# Patient Record
Sex: Male | Born: 1980 | Race: Black or African American | Hispanic: No | Marital: Single | Smoking: Former smoker
Health system: Southern US, Community
[De-identification: ages and names within clinical notes are randomized; demographics above are authoritative.]

## PROBLEM LIST (undated history)

## (undated) DIAGNOSIS — I10 Essential (primary) hypertension: Secondary | ICD-10-CM

## (undated) DIAGNOSIS — I8390 Asymptomatic varicose veins of unspecified lower extremity: Secondary | ICD-10-CM

## (undated) DIAGNOSIS — K219 Gastro-esophageal reflux disease without esophagitis: Secondary | ICD-10-CM

## (undated) DIAGNOSIS — I499 Cardiac arrhythmia, unspecified: Secondary | ICD-10-CM

## (undated) DIAGNOSIS — IMO0002 Reserved for concepts with insufficient information to code with codable children: Secondary | ICD-10-CM

## (undated) DIAGNOSIS — E119 Type 2 diabetes mellitus without complications: Secondary | ICD-10-CM

## (undated) DIAGNOSIS — I878 Other specified disorders of veins: Secondary | ICD-10-CM

## (undated) HISTORY — DX: Asymptomatic varicose veins of unspecified lower extremity: I83.90

## (undated) HISTORY — PX: VARICOSE VEIN SURGERY: SHX832

## (undated) HISTORY — DX: Cardiac arrhythmia, unspecified: I49.9

## (undated) HISTORY — DX: Reserved for concepts with insufficient information to code with codable children: IMO0002

---

## 2011-03-10 DIAGNOSIS — K219 Gastro-esophageal reflux disease without esophagitis: Secondary | ICD-10-CM | POA: Insufficient documentation

## 2011-03-11 ENCOUNTER — Encounter: Payer: Self-pay | Admitting: *Deleted

## 2011-03-11 ENCOUNTER — Emergency Department (HOSPITAL_BASED_OUTPATIENT_CLINIC_OR_DEPARTMENT_OTHER)
Admission: EM | Admit: 2011-03-11 | Discharge: 2011-03-11 | Discharge status: Against Medical Advice | Payer: PRIVATE HEALTH INSURANCE | Attending: Emergency Medicine | Admitting: Emergency Medicine

## 2011-03-11 HISTORY — DX: Essential (primary) hypertension: I10

## 2011-03-11 HISTORY — DX: Gastro-esophageal reflux disease without esophagitis: K21.9

## 2011-03-11 NOTE — ED Notes (Signed)
Pt left AMA prior to being seen by EDP - Pt states he is tired and doesn't wish to wait- states he will follow up with his doctor- advised pt he can return at any time if he likes

## 2011-03-11 NOTE — ED Notes (Signed)
Reports burning epigastric pain after eating- nausea also- has been using acid reducers without significant relief

## 2012-05-02 ENCOUNTER — Emergency Department: Payer: Self-pay | Admitting: Emergency Medicine

## 2012-05-02 LAB — CBC
HCT: 46 % (ref 40.0–52.0)
MCHC: 33.5 g/dL (ref 32.0–36.0)
MCV: 85 fL (ref 80–100)
Platelet: 203 10*3/uL (ref 150–440)
RBC: 5.4 10*6/uL (ref 4.40–5.90)
RDW: 14.1 % (ref 11.5–14.5)

## 2012-05-02 LAB — BASIC METABOLIC PANEL
BUN: 11 mg/dL (ref 7–18)
Calcium, Total: 8.6 mg/dL (ref 8.5–10.1)
Chloride: 108 mmol/L — ABNORMAL HIGH (ref 98–107)
Co2: 29 mmol/L (ref 21–32)
Creatinine: 1.13 mg/dL (ref 0.60–1.30)
EGFR (Non-African Amer.): >60
Glucose: 113 mg/dL — ABNORMAL HIGH (ref 65–99)
Osmolality: 283 (ref 275–301)
Potassium: 3.7 mmol/L (ref 3.5–5.1)
Sodium: 142 mmol/L (ref 136–145)

## 2012-05-02 LAB — TROPONIN I: Troponin-I: <0.02 ng/mL

## 2012-05-02 LAB — CK TOTAL AND CKMB (NOT AT ARMC): CK, Total: 154 U/L (ref 35–232)

## 2012-05-03 LAB — HEPATIC FUNCTION PANEL A (ARMC)
Albumin: 3.8 g/dL (ref 3.4–5.0)
Alkaline Phosphatase: 78 U/L (ref 50–136)
SGOT(AST): 38 U/L — ABNORMAL HIGH (ref 15–37)

## 2012-05-09 HISTORY — PX: CHOLECYSTECTOMY: SHX55

## 2012-05-15 ENCOUNTER — Ambulatory Visit: Payer: Self-pay | Admitting: Surgery

## 2012-05-16 LAB — PLATELET COUNT: Platelet: 189 10*3/uL (ref 150–440)

## 2012-05-19 LAB — PATHOLOGY REPORT

## 2012-06-24 ENCOUNTER — Emergency Department: Payer: Self-pay | Admitting: Emergency Medicine

## 2012-12-15 ENCOUNTER — Other Ambulatory Visit: Payer: Self-pay | Admitting: *Deleted

## 2012-12-15 DIAGNOSIS — I83893 Varicose veins of bilateral lower extremities with other complications: Secondary | ICD-10-CM

## 2013-01-11 ENCOUNTER — Encounter (HOSPITAL_COMMUNITY): Payer: PRIVATE HEALTH INSURANCE

## 2013-01-11 ENCOUNTER — Encounter: Payer: PRIVATE HEALTH INSURANCE | Admitting: Vascular Surgery

## 2013-01-22 ENCOUNTER — Encounter: Payer: Self-pay | Admitting: Surgery

## 2013-01-22 ENCOUNTER — Encounter: Payer: Self-pay | Admitting: Vascular Surgery

## 2013-01-25 ENCOUNTER — Ambulatory Visit (HOSPITAL_COMMUNITY)
Admission: RE | Admit: 2013-01-25 | Discharge: 2013-01-25 | Disposition: A | Discharge status: Home / Self Care | Payer: BC Managed Care – PPO | Source: Ambulatory Visit | Attending: Surgery | Admitting: Surgery

## 2013-01-25 ENCOUNTER — Encounter: Payer: Self-pay | Admitting: Surgery

## 2013-01-25 ENCOUNTER — Ambulatory Visit (INDEPENDENT_AMBULATORY_CARE_PROVIDER_SITE_OTHER): Payer: BC Managed Care – PPO | Admitting: Surgery

## 2013-01-25 ENCOUNTER — Encounter (INDEPENDENT_AMBULATORY_CARE_PROVIDER_SITE_OTHER): Payer: Self-pay

## 2013-01-25 VITALS — BP 138/91 | HR 78 | Resp 16 | Ht 74.0 in | Wt 285.0 lb

## 2013-01-25 DIAGNOSIS — I739 Peripheral vascular disease, unspecified: Secondary | ICD-10-CM | POA: Insufficient documentation

## 2013-01-25 DIAGNOSIS — M79609 Pain in unspecified limb: Secondary | ICD-10-CM

## 2013-01-25 DIAGNOSIS — I83893 Varicose veins of bilateral lower extremities with other complications: Secondary | ICD-10-CM | POA: Insufficient documentation

## 2013-01-25 DIAGNOSIS — IMO0002 Reserved for concepts with insufficient information to code with codable children: Secondary | ICD-10-CM

## 2013-01-25 DIAGNOSIS — I7025 Atherosclerosis of native arteries of other extremities with ulceration: Secondary | ICD-10-CM | POA: Insufficient documentation

## 2013-01-25 HISTORY — DX: Reserved for concepts with insufficient information to code with codable children: IMO0002

## 2013-01-25 NOTE — Progress Notes (Signed)
Vascular and Vein Specialist of [facility]   Patient name: Adam Holland MRN: [id number] DOB: 1981-03-18 Sex: male   Referred by: Dr. Fredrik Cove  Reason for referral:  Chief Complaint  Patient presents with  . New Evaluation    Pt. ref by Dr. Fredrik Cove  C/O  Right Foot  ulcer, painful and drainage, duration 1 week.  Left Foot has dry ulcer.      HISTORY OF PRESENT ILLNESS: This is a very pleasant 32 year old gentleman referred today for evaluation of a right foot ulcer.  The patient states that this has been present for approximately 2 weeks.  He is wrapping it with an Ace wrap.  He has previously had an ulcer in the same area approximately one year ago.  He was given antibiotics for this and ultimately went away.  He is concerned also about early ulceration of the left leg.  He does suffer from allergies.    Past Medical History  Diagnosis Date  . Hypertension   . Acid reflux   . Ulcer Oct. 20, 2014    Right foot and Left foot  . Irregular heart beat     Past Surgical History  Procedure Laterality Date  . Varicose vein surgery    . Cholecystectomy  Feb. 2014    Gall Bladder    History   Social History  . Marital Status: Single    Spouse Name: N/A    Number of Children: N/A  . Years of Education: N/A   Occupational History  . Not on file.   Social History Main Topics  . Smoking status: Former Smoker    Quit date: 04/08/1998  . Smokeless tobacco: Never Used  . Alcohol Use: Yes     Comment: occasional  . Drug Use: No  . Sexual Activity: Not on file   Other Topics Concern  . Not on file   Social History Narrative  . No narrative on file    Family History  Problem Relation Age of Onset  . Deep vein thrombosis Father     Varicose Veins  . Heart attack Father     Allergies as of 01/25/2013  . (No Known Allergies)    Current Outpatient Prescriptions on File Prior to Visit  Medication Sig Dispense Refill  . acetaminophen (TYLENOL) 650 MG CR tablet Take 650  mg by mouth every 8 (eight) hours as needed.        Marland Kitchen aluminum & magnesium hydroxide-simethicone (MYLANTA) 500-450-40 MG/5ML suspension Take by mouth every 6 (six) hours as needed.        . ranitidine (ZANTAC) 150 MG tablet Take 150 mg by mouth 2 (two) times daily.         No current facility-administered medications on file prior to visit.     REVIEW OF SYSTEMS: Cardiovascular: No chest pain, chest pressure, palpitations, orthopnea, or dyspnea on exertion. No claudication or rest pain,  No history of DVT or phlebitis. Pulmonary: No productive cough, asthma or wheezing. Neurologic: No weakness, paresthesias, aphasia, or amaurosis. No dizziness. Hematologic: No bleeding problems or clotting disorders. Musculoskeletal: No joint pain or joint swelling. Gastrointestinal: No blood in stool or hematemesis Genitourinary: No dysuria or hematuria. Psychiatric:: No history of major depression. Integumentary: No rashes or ulcers. Constitutional: No fever or chills.  PHYSICAL EXAMINATION: General: The patient appears their stated age.  Vital signs are BP 138/91  Pulse 78  Resp 16  Ht 6\' 2"  (1.88 m)  Wt 285 lb (129.275 kg)  BMI 36.58 kg/m2  SpO2 100% HEENT:  No gross abnormalities Pulmonary: Respirations are non-labored Abdomen: Soft and non-tender  Musculoskeletal: There are no major deformities.   Neurologic: No focal weakness or paresthesias are detected, Skin: Open ulceration at the medial ankle on the right leg as well as at the ball of the foot.  No cellulitis.  Early skin changes in the similar area on the left. Psychiatric: The patient has normal affect. Cardiovascular: There is a regular rate and rhythm without significant murmur appreciated.  Palpable pedal pulses  Diagnostic Studies: Venous reflux studies were performed today.  The patient has deep reflux bilaterally.  He also has segments within bilateral saphenous veins which are positive for reflux.  Bilateral short saphenous  veins show reflux.    Assessment:  Venous insufficiency with ulceration, right leg Plan: This is the patient's second venous ulcer.  The first was able to heal with antibiotics.  The patient does complain of swelling in his right leg.  For his ulcer, he wore compression stockings of unknown strength.  This ulcer is been present for about 2 weeks.  I am placing him in a Unna's boots on the right leg today.  This will be changed by home health 3 times a week.  He will followup with me in one month.  At that time, if his ulcer has healed I will give him a prescription for formal appropriate 20-30 mm compression stockings.  I will also refer him for possible laser ablation.  He was given 30 oxycodone tablets today for pain     V. Charlena Cross, M.D. V[facility] Vein Specialists of [place] Office: [phone] Pager:  [phone]

## 2013-01-26 ENCOUNTER — Ambulatory Visit (INDEPENDENT_AMBULATORY_CARE_PROVIDER_SITE_OTHER): Payer: BC Managed Care – PPO | Admitting: *Deleted

## 2013-01-26 DIAGNOSIS — I83893 Varicose veins of bilateral lower extremities with other complications: Secondary | ICD-10-CM

## 2013-01-26 DIAGNOSIS — I739 Peripheral vascular disease, unspecified: Secondary | ICD-10-CM

## 2013-01-26 NOTE — Progress Notes (Signed)
Patient called c/o excessive drainage oozing from unna boot.  I scheduled him to come to office.  I removed unna boot, thoroughly cleansed his leg and foot with carra klenz and applied a new unna boot dressing.  The patient is returning Friday, October 24,2014.  He is to call sooner if any changes occur. Patient understood instructions given.

## 2013-01-27 ENCOUNTER — Telehealth: Payer: Self-pay | Admitting: *Deleted

## 2013-01-27 NOTE — Telephone Encounter (Signed)
I called patient to inform him that I had contacted [facility], [facility], [facility] and [facility] and none of the agencies would agree to serve him.  I asked the patient to come to the office and I would provide the care needed three times weekly. The patient agrees and understands the instructions.

## 2013-01-29 ENCOUNTER — Encounter (INDEPENDENT_AMBULATORY_CARE_PROVIDER_SITE_OTHER): Payer: BC Managed Care – PPO

## 2013-01-29 DIAGNOSIS — I739 Peripheral vascular disease, unspecified: Secondary | ICD-10-CM

## 2013-02-01 ENCOUNTER — Encounter (INDEPENDENT_AMBULATORY_CARE_PROVIDER_SITE_OTHER): Payer: BC Managed Care – PPO

## 2013-02-01 DIAGNOSIS — I83893 Varicose veins of bilateral lower extremities with other complications: Secondary | ICD-10-CM

## 2013-02-03 ENCOUNTER — Ambulatory Visit (INDEPENDENT_AMBULATORY_CARE_PROVIDER_SITE_OTHER): Payer: BC Managed Care – PPO | Admitting: Vascular Surgery

## 2013-02-03 ENCOUNTER — Encounter (INDEPENDENT_AMBULATORY_CARE_PROVIDER_SITE_OTHER): Payer: BC Managed Care – PPO

## 2013-02-03 ENCOUNTER — Encounter: Payer: Self-pay | Admitting: Vascular Surgery

## 2013-02-03 VITALS — BP 138/87 | HR 73 | Resp 18 | Ht 74.0 in | Wt 280.0 lb

## 2013-02-03 DIAGNOSIS — I83009 Varicose veins of unspecified lower extremity with ulcer of unspecified site: Secondary | ICD-10-CM

## 2013-02-03 DIAGNOSIS — I83893 Varicose veins of bilateral lower extremities with other complications: Secondary | ICD-10-CM

## 2013-02-03 DIAGNOSIS — I739 Peripheral vascular disease, unspecified: Secondary | ICD-10-CM

## 2013-02-03 DIAGNOSIS — IMO0001 Reserved for inherently not codable concepts without codable children: Secondary | ICD-10-CM

## 2013-02-03 MED ORDER — TRAMADOL HCL 50 MG PO TABS: 50.0000 mg | ORAL_TABLET | Freq: Four times a day (QID) | ORAL | Status: DC | PRN

## 2013-02-03 NOTE — Progress Notes (Signed)
VASCULAR & VEIN SPECIALISTS OF   Established Venous Insufficiency  History of Present Illness  Adam Holland is a 32 y.o. (28-Nov-1980) male who presents with chief complaint: pruritus and skin rashes from Oxycodone.  This patient is now 3 weeks into Unna boot to RLE for venous stasis ulcer.  The patient's had pruritus and development of rashes on both palms after starting oxycodone.  He notes no pruritus when not taking the Oxycodone.  The patient's treatment regimen currently included: maximal medical management and Unna boot.  The patient's PMH, PSH, SH, FamHx, Med, and Allergies are unchanged from 01/25/13.  On ROS today: pruritus, rash on both palms  Physical Examination  Filed Vitals:   02/03/13 1558  BP: 138/87  Pulse: 73  Resp: 18  Height: 6\' 2"  (1.88 m)  Weight: 280 lb (127.007 kg)   Body mass index is 35.93 kg/(m^2).  General: A&O x 3, WD, obese  Vascular: palpable pulse in R foot  Musculoskeletal: M/S 5/5 throughout , two small areas of skin breakdown on dorsum of right foot, swelling 1+, no tracking erythema or TTP light touch  Neurologic: Pain and light touch intact in extremities , Motor exam as listed above  Dermatologic:  Vesicular lesions on both palms without frank drainage  Medical Decision Making  Adam Holland is a 32 y.o. male who presents with: RLE VSU, allergic reaction to oxycodone   The patient is going to continue with Unna boot to RLE.  He will discontinue the oxycodone and start Ultram 50 mg 1 po q6 hr prn pain  He already has follow up setup with Dr. Myra Gianotti.  Thank you for allowing [place] to participate in this patient's care.  Leonides Sake, MD V[facility] and Vein Specialists of [place] Office: [phone] Pager: [phone]  02/03/2013, 5:38 PM

## 2013-02-04 ENCOUNTER — Encounter: Payer: Self-pay | Admitting: *Deleted

## 2013-02-05 ENCOUNTER — Ambulatory Visit (INDEPENDENT_AMBULATORY_CARE_PROVIDER_SITE_OTHER): Payer: BC Managed Care – PPO

## 2013-02-05 ENCOUNTER — Encounter (HOSPITAL_COMMUNITY): Payer: Self-pay | Admitting: Emergency Medicine

## 2013-02-05 ENCOUNTER — Emergency Department (HOSPITAL_COMMUNITY)
Admission: EM | Admit: 2013-02-05 | Discharge: 2013-02-05 | Disposition: A | Discharge status: Home / Self Care | Payer: BC Managed Care – PPO | Attending: Emergency Medicine | Admitting: Emergency Medicine

## 2013-02-05 DIAGNOSIS — IMO0001 Reserved for inherently not codable concepts without codable children: Secondary | ICD-10-CM

## 2013-02-05 DIAGNOSIS — Z79899 Other long term (current) drug therapy: Secondary | ICD-10-CM | POA: Insufficient documentation

## 2013-02-05 DIAGNOSIS — K219 Gastro-esophageal reflux disease without esophagitis: Secondary | ICD-10-CM | POA: Insufficient documentation

## 2013-02-05 DIAGNOSIS — I83009 Varicose veins of unspecified lower extremity with ulcer of unspecified site: Secondary | ICD-10-CM

## 2013-02-05 DIAGNOSIS — M79609 Pain in unspecified limb: Secondary | ICD-10-CM | POA: Insufficient documentation

## 2013-02-05 DIAGNOSIS — R21 Rash and other nonspecific skin eruption: Secondary | ICD-10-CM | POA: Insufficient documentation

## 2013-02-05 DIAGNOSIS — Z87891 Personal history of nicotine dependence: Secondary | ICD-10-CM | POA: Insufficient documentation

## 2013-02-05 DIAGNOSIS — L97909 Non-pressure chronic ulcer of unspecified part of unspecified lower leg with unspecified severity: Secondary | ICD-10-CM

## 2013-02-05 DIAGNOSIS — Z791 Long term (current) use of non-steroidal anti-inflammatories (NSAID): Secondary | ICD-10-CM | POA: Insufficient documentation

## 2013-02-05 DIAGNOSIS — M79671 Pain in right foot: Secondary | ICD-10-CM

## 2013-02-05 DIAGNOSIS — L301 Dyshidrosis [pompholyx]: Secondary | ICD-10-CM | POA: Insufficient documentation

## 2013-02-05 DIAGNOSIS — I10 Essential (primary) hypertension: Secondary | ICD-10-CM | POA: Insufficient documentation

## 2013-02-05 LAB — CBC WITH DIFFERENTIAL/PLATELET
Basophils Relative: 0 % (ref 0–1)
Eosinophils Absolute: 0.4 10*3/uL (ref 0.0–0.7)
Eosinophils Relative: 4 % (ref 0–5)
HCT: 42.2 % (ref 39.0–52.0)
Hemoglobin: 14.2 g/dL (ref 13.0–17.0)
MCH: 28.6 pg (ref 26.0–34.0)
MCHC: 33.6 g/dL (ref 30.0–36.0)
MCV: 85.1 fL (ref 78.0–100.0)
Monocytes Absolute: 0.9 10*3/uL (ref 0.1–1.0)
Monocytes Relative: 8 % (ref 3–12)
Neutro Abs: 6.8 10*3/uL (ref 1.7–7.7)
RDW: 14.1 % (ref 11.5–15.5)

## 2013-02-05 LAB — COMPREHENSIVE METABOLIC PANEL
Alkaline Phosphatase: 52 U/L (ref 39–117)
BUN: 12 mg/dL (ref 6–23)
Creatinine, Ser: 0.94 mg/dL (ref 0.50–1.35)
GFR calc Af Amer: >90 mL/min (ref >90)
Glucose, Bld: 101 mg/dL — ABNORMAL HIGH (ref 70–99)
Potassium: 3.8 mEq/L (ref 3.5–5.1)
Total Protein: 7.1 g/dL (ref 6.0–8.3)

## 2013-02-05 LAB — ACETAMINOPHEN LEVEL: Acetaminophen (Tylenol), Serum: <15 ug/mL (ref 10–30)

## 2013-02-05 MED ORDER — TRIAMCINOLONE ACETONIDE 0.1 % EX CREA: TOPICAL_CREAM | Freq: Two times a day (BID) | CUTANEOUS | Status: DC

## 2013-02-05 MED ORDER — FEXOFENADINE HCL 60 MG PO TABS: 180.0000 mg | ORAL_TABLET | Freq: Two times a day (BID) | ORAL | Status: DC

## 2013-02-05 MED ORDER — FEXOFENADINE HCL 60 MG PO TABS: 180.0000 mg | ORAL_TABLET | Freq: Every day | ORAL | Status: DC

## 2013-02-05 MED ORDER — HYDROCODONE-ACETAMINOPHEN 5-325 MG PO TABS: 2.0000 | ORAL_TABLET | ORAL | Status: DC | PRN

## 2013-02-05 MED ORDER — HYDROMORPHONE HCL PF 2 MG/ML IJ SOLN
2.0000 mg | Freq: Once | INTRAMUSCULAR | Status: AC
  Administered 2013-02-05: 2 mg via INTRAMUSCULAR
  Filled 2013-02-05: qty 1

## 2013-02-05 MED ORDER — ONDANSETRON 4 MG PO TBDP
8.0000 mg | ORAL_TABLET | Freq: Once | ORAL | Status: AC
  Administered 2013-02-05: 8 mg via ORAL
  Filled 2013-02-05: qty 2

## 2013-02-05 MED ORDER — ONDANSETRON 8 MG PO TBDP: 8.0000 mg | ORAL_TABLET | Freq: Three times a day (TID) | ORAL | Status: DC | PRN

## 2013-02-05 NOTE — Progress Notes (Signed)
Mr. Adam Holland was seen today for an Adam Holland Boot change on his Right Medial foot.  Wound was cleansed with Adam Holland and dry 4X4's.   Pt. Complains of pain level 8 plus. Wound on  Right medial foot had signs of serous exudate and discoloration.  New Unna Boot was applied with no difficulty. Mr.  Holland will return in one week for reapplication.  Pt. Voiced understanding of instructions.  Adam Holland, RMA-AMT

## 2013-02-05 NOTE — ED Provider Notes (Signed)
CSN: [id number]     Arrival date & time 02/05/13  0250 History   First MD Initiated Contact with Patient 02/05/13 0308     Chief Complaint  Patient presents with  . Foot Pain   (Consider location/radiation/quality/duration/timing/severity/associated sxs/prior Treatment) HPI 32 year old male presents to emergency room complaining of right foot pain.  Patient has venous stasis ulcer of the right ankle.  He is currently in a do it.  He is being followed by [facility], Dr. Imogene Burn.  Patient recently developed a rash to his hands that he has attributed to Percocet.  He stopped taking the Percocet, secondary to the rash.  He was switched to tramadol, which she reports is not helping his pain.  Tonight he took 2 doses as well as Tylenol, felt dizzy, and sick to his stomach.  He denies any fevers, chills.  He denies any risk factors for HIV or syphilis.  Patient has history of eczema.  He has followup today at the vein specialist clinic Past Medical History  Diagnosis Date  . Hypertension   . Acid reflux   . Ulcer Oct. 20, 2014    Right foot and Left foot  . Irregular heart beat   . Varicose veins    Past Surgical History  Procedure Laterality Date  . Varicose vein surgery    . Cholecystectomy  Feb. 2014    Gall Bladder   Family History  Problem Relation Age of Onset  . Deep vein thrombosis Father     Varicose Veins  . Heart attack Father    History  Substance Use Topics  . Smoking status: Former Smoker    Types: Cigarettes    Quit date: 04/08/1998  . Smokeless tobacco: Never Used  . Alcohol Use: Yes     Comment: occasional    Review of Systems  See History of Present Illness; otherwise all other systems are reviewed and negative Allergies  Oxycodone  Home Medications   Current Outpatient Rx  Name  Route  Sig  Dispense  Refill  . acetaminophen (TYLENOL) 650 MG CR tablet   Oral   Take 650 mg by mouth every 8 (eight) hours as needed.           . furosemide  (LASIX) 20 MG tablet   Oral   Take 20 mg by mouth daily.         . naproxen sodium (ANAPROX) 220 MG tablet   Oral   Take 220 mg by mouth 2 (two) times daily with a meal.         . aluminum & magnesium hydroxide-simethicone (MYLANTA) 500-450-40 MG/5ML suspension   Oral   Take by mouth every 6 (six) hours as needed.           . fexofenadine (ALLEGRA) 60 MG tablet   Oral   Take 3 tablets (180 mg total) by mouth daily.   30 tablet   0   . HYDROcodone-acetaminophen (NORCO/VICODIN) 5-325 MG per tablet   Oral   Take 2 tablets by mouth every 4 (four) hours as needed for pain.   20 tablet   0   . ondansetron (ZOFRAN ODT) 8 MG disintegrating tablet   Oral   Take 1 tablet (8 mg total) by mouth every 8 (eight) hours as needed for nausea.   20 tablet   0   . ranitidine (ZANTAC) 150 MG tablet   Oral   Take 150 mg by mouth 2 (two) times daily.           Marland Kitchen  triamcinolone cream (KENALOG) 0.1 %   Topical   Apply topically 2 (two) times daily.   30 g   0    BP 155/91  Pulse 67  Temp(Src) 97.8 F (36.6 C) (Oral)  Resp 19  SpO2 99% Physical Exam  Nursing note and vitals reviewed. Constitutional: He is oriented to person, place, and time. He appears well-developed and well-nourished. He appears distressed.  HENT:  Head: Normocephalic and atraumatic.  Right Ear: External ear normal.  Left Ear: External ear normal.  Nose: Nose normal.  Mouth/Throat: Oropharynx is clear and moist. No oropharyngeal exudate.  Eyes: Conjunctivae and EOM are normal. Pupils are equal, round, and reactive to light.  Neck: Normal range of motion. Neck supple. No JVD present. No tracheal deviation present. No thyromegaly present.  Cardiovascular: Normal rate, regular rhythm, normal heart sounds and intact distal pulses.  Exam reveals no gallop and no friction rub.   No murmur heard. Pulmonary/Chest: Effort normal and breath sounds normal. No stridor. No respiratory distress. He has no wheezes. He  has no rales. He exhibits no tenderness.  Abdominal: Soft. Bowel sounds are normal. He exhibits no distension and no mass. There is no tenderness. There is no rebound and no guarding.  Musculoskeletal:  Right leg wrapped in una boot area and cap refill of toes.  Less than 3 seconds  Lymphadenopathy:    He has no cervical adenopathy.  Neurological: He is alert and oriented to person, place, and time. He exhibits normal muscle tone. Coordination normal.  Skin: Skin is warm and dry. Rash (patient has vesicular rash to both hands, palmar and along fingers.) noted. No erythema. No pallor.    ED Course  Procedures (including critical care time) Labs Review Labs Reviewed  CBC WITH DIFFERENTIAL - Abnormal; Notable for the following:    WBC 10.6 (*)    All other components within normal limits  COMPREHENSIVE METABOLIC PANEL - Abnormal; Notable for the following:    Glucose, Bld 101 (*)    Total Bilirubin 0.2 (*)    All other components within normal limits  ACETAMINOPHEN LEVEL  RPR   Imaging Review No results found.    MDM   1. Foot pain, right   2. Dyshidrosis    Patient feeling better after pain medication.  I do not feel his rash is secondary to Percocet, especially in light of history of eczema.  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  Will start on a steroid cream, and Allegra.  Patient followup with dermatology.  He does not have a primary care Dr., I have given him the wellness clinic.  Number.    Olivia Mackie, MD 02/05/13 [phone]

## 2013-02-05 NOTE — ED Notes (Signed)
Patient is being seen by [facility] for a venous stasis ulcer of the right ankle. Patient has been taking Tramadol and tylenol for pain, but patient is reporting that has not helped and has made him sick.

## 2013-02-08 ENCOUNTER — Encounter (INDEPENDENT_AMBULATORY_CARE_PROVIDER_SITE_OTHER): Payer: BC Managed Care – PPO

## 2013-02-08 DIAGNOSIS — I739 Peripheral vascular disease, unspecified: Secondary | ICD-10-CM

## 2013-02-08 DIAGNOSIS — I83893 Varicose veins of bilateral lower extremities with other complications: Secondary | ICD-10-CM

## 2013-02-08 DIAGNOSIS — I83009 Varicose veins of unspecified lower extremity with ulcer of unspecified site: Secondary | ICD-10-CM

## 2013-02-10 ENCOUNTER — Encounter (INDEPENDENT_AMBULATORY_CARE_PROVIDER_SITE_OTHER): Payer: BC Managed Care – PPO

## 2013-02-10 DIAGNOSIS — I83009 Varicose veins of unspecified lower extremity with ulcer of unspecified site: Secondary | ICD-10-CM

## 2013-02-10 DIAGNOSIS — I739 Peripheral vascular disease, unspecified: Secondary | ICD-10-CM

## 2013-02-10 DIAGNOSIS — I83893 Varicose veins of bilateral lower extremities with other complications: Secondary | ICD-10-CM

## 2013-02-12 ENCOUNTER — Encounter (INDEPENDENT_AMBULATORY_CARE_PROVIDER_SITE_OTHER): Payer: BC Managed Care – PPO

## 2013-02-12 DIAGNOSIS — I83893 Varicose veins of bilateral lower extremities with other complications: Secondary | ICD-10-CM

## 2013-02-17 ENCOUNTER — Ambulatory Visit (INDEPENDENT_AMBULATORY_CARE_PROVIDER_SITE_OTHER): Payer: BC Managed Care – PPO | Admitting: *Deleted

## 2013-02-17 DIAGNOSIS — I83893 Varicose veins of bilateral lower extremities with other complications: Secondary | ICD-10-CM

## 2013-02-24 ENCOUNTER — Encounter (INDEPENDENT_AMBULATORY_CARE_PROVIDER_SITE_OTHER): Payer: BC Managed Care – PPO

## 2013-02-24 DIAGNOSIS — I83893 Varicose veins of bilateral lower extremities with other complications: Secondary | ICD-10-CM

## 2013-02-26 ENCOUNTER — Encounter: Payer: Self-pay | Admitting: Surgery

## 2013-02-26 NOTE — Progress Notes (Signed)
Patient was in for unna boot change. Cleaned right leg with Deatra James. The wound is healing and patient is stating the pain is subsiding and the draining is less.  He is returning on Monday, 03/01/13 to see Dr Myra Gianotti.

## 2013-03-01 ENCOUNTER — Encounter: Payer: Self-pay | Admitting: Surgery

## 2013-03-01 ENCOUNTER — Ambulatory Visit (INDEPENDENT_AMBULATORY_CARE_PROVIDER_SITE_OTHER): Payer: BC Managed Care – PPO | Admitting: Surgery

## 2013-03-01 VITALS — BP 131/82 | HR 82 | Ht 74.0 in | Wt 284.0 lb

## 2013-03-01 DIAGNOSIS — I83009 Varicose veins of unspecified lower extremity with ulcer of unspecified site: Secondary | ICD-10-CM

## 2013-03-01 DIAGNOSIS — IMO0001 Reserved for inherently not codable concepts without codable children: Secondary | ICD-10-CM

## 2013-03-01 NOTE — Progress Notes (Signed)
Patient name: Adam Holland MRN: [id number] DOB: 1981-03-01 Sex: male     Chief Complaint  Patient presents with  . Re-evaluation    1 month f/u - unna boot changes    HISTORY OF PRESENT ILLNESS: The patient comes back today for followup.  I met him approximately one month ago for evaluation of a right foot ulcer that had been present at that time for 2 weeks.  He had previously had an ulcer in the same area about one year ago.  The patient has been wearing graduated compression, 20-30 mm Hg since 2003.  I placed him in a Unna boot to help heal his ulcer.  He also had a venous insufficiency workup which reveals reflux within the deep system on the right as well as the short saphenous and a section of the great saphenous vein.  The patient feels that his foot has improved significantly and he also has decreased in size  Past Medical History  Diagnosis Date  . Hypertension   . Acid reflux   . Ulcer Oct. 20, 2014    Right foot and Left foot  . Irregular heart beat   . Varicose veins     Past Surgical History  Procedure Laterality Date  . Varicose vein surgery    . Cholecystectomy  Feb. 2014    Gall Bladder    History   Social History  . Marital Status: Single    Spouse Name: N/A    Number of Children: N/A  . Years of Education: N/A   Occupational History  . Not on file.   Social History Main Topics  . Smoking status: Former Smoker    Types: Cigarettes    Quit date: 04/08/1998  . Smokeless tobacco: Never Used  . Alcohol Use: Yes     Comment: occasional  . Drug Use: No  . Sexual Activity: Not on file   Other Topics Concern  . Not on file   Social History Narrative  . No narrative on file    Family History  Problem Relation Age of Onset  . Deep vein thrombosis Father     Varicose Veins  . Heart attack Father     Allergies as of 03/01/2013 - Review Complete 03/01/2013  Allergen Reaction Noted  . Oxycodone  02/05/2013    Current Outpatient  Prescriptions on File Prior to Visit  Medication Sig Dispense Refill  . acetaminophen (TYLENOL) 650 MG CR tablet Take 650 mg by mouth every 8 (eight) hours as needed.        Marland Kitchen aluminum & magnesium hydroxide-simethicone (MYLANTA) 500-450-40 MG/5ML suspension Take by mouth every 6 (six) hours as needed.        . fexofenadine (ALLEGRA) 60 MG tablet Take 3 tablets (180 mg total) by mouth daily.  30 tablet  0  . furosemide (LASIX) 20 MG tablet Take 20 mg by mouth daily.      Marland Kitchen HYDROcodone-acetaminophen (NORCO/VICODIN) 5-325 MG per tablet Take 2 tablets by mouth every 4 (four) hours as needed for pain.  20 tablet  0  . naproxen sodium (ANAPROX) 220 MG tablet Take 220 mg by mouth 2 (two) times daily with a meal.      . ondansetron (ZOFRAN ODT) 8 MG disintegrating tablet Take 1 tablet (8 mg total) by mouth every 8 (eight) hours as needed for nausea.  20 tablet  0  . ranitidine (ZANTAC) 150 MG tablet Take 150 mg by mouth 2 (two) times daily.        Marland Kitchen  triamcinolone cream (KENALOG) 0.1 % Apply topically 2 (two) times daily.  30 g  0   No current facility-administered medications on file prior to visit.     REVIEW OF SYSTEMS: Please see history of present illness, otherwise no changes  PHYSICAL EXAMINATION:   Vital signs are BP 131/82  Pulse 82  Ht 6\' 2"  (1.88 m)  Wt 284 lb (128.822 kg)  BMI 36.45 kg/m2  SpO2 98% General: The patient appears their stated age. HEENT:  No gross abnormalities Pulmonary:  Non labored breathing Musculoskeletal: There are no major deformities. Neurologic: No focal weakness or paresthesias are detected, Skin: Chronic skin changes on the medial side of the right leg.  There is also a 2 mm x 7 mm ulcer on the medial side of the foot.  No surrounding erythema. Psychiatric: The patient has normal affect. Cardiovascular: There is a regular rate and rhythm.  Palpable pedal pulse   Diagnostic Studies The patient underwent duplex evaluation last month which showed  bilateral deep system reflux.  There is also bilateral great and small saphenous reflux.  Assessment: Venous stasis ulcer Plan: The patient's pain in his right leg has improved somewhat with compression and ulcer healing, however he still does have pain in that foot.  His swelling has also improved with the [employer].  Because of the ulcer and the pain as well as persistent edema, I feel he would be a candidate for laser ablation of the small saphenous vein and possibly the great saphenous vein on the right to facilitate ulcer healing as well as decrease the recurrence rate.  The patient understands that this may not completely correct his edema in his right leg but should offer some improvement in his symptomatology.  He was to get this done as soon as possible.  [facility] for further evaluation and insurance approval.  He will be here tomorrow.  Jorge Ny, M.D. [facility] Special[facility] of [place] Office: [phone] Pager:  [phone]

## 2013-03-02 ENCOUNTER — Encounter: Payer: Self-pay | Admitting: Vascular Surgery

## 2013-03-02 ENCOUNTER — Ambulatory Visit (INDEPENDENT_AMBULATORY_CARE_PROVIDER_SITE_OTHER): Payer: BC Managed Care – PPO | Admitting: Vascular Surgery

## 2013-03-02 VITALS — BP 137/90 | HR 83 | Resp 16 | Ht 74.0 in | Wt 284.0 lb

## 2013-03-02 DIAGNOSIS — I83009 Varicose veins of unspecified lower extremity with ulcer of unspecified site: Secondary | ICD-10-CM

## 2013-03-02 DIAGNOSIS — IMO0001 Reserved for inherently not codable concepts without codable children: Secondary | ICD-10-CM

## 2013-03-02 DIAGNOSIS — L97909 Non-pressure chronic ulcer of unspecified part of unspecified lower leg with unspecified severity: Secondary | ICD-10-CM

## 2013-03-02 NOTE — Progress Notes (Signed)
Problems with Activities of Daily Living Secondary to Leg Pain  1. 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   Has to stand for prolonged periods and lift and assist patients.  This is extremely difficult die to leg pain and ankle ulcer.   2. 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  Has to stand for 8 hours and assist patients and this is very difficult due to leg pain and ankle ulcer.   3. Adam Holland has a small son and has difficulty with childcare, cleaning house, climbing stairs due to leg pain and ankle ulcer.   Failure of  Conservative Therapy:  1. Worn 20-30 mm Hg thigh high compression hose >3 months with no relief of symptoms.  2. Frequently elevates legs-no relief of symptoms  3. Taken Ibuprofen 600 Mg TID with no relief of symptoms.  Here today for continued discussion regarding possible treatment of his venous hypertension. I've reviewed his notes from Dr. Myra Gianotti and also his bilateral venous duplex exam from 01/25/2013. I damaged his veins with SonoSite ultrasound today myself. This does show gross reflux in his right small saphenous vein with extension of the tributaries in his calf. He does have a prior history of vein stripping. The details of this are unavailable. From his duplex it looks like he may have had tributary phlebectomy or may have had proximal venous stripping. Once imaging his right great saphenous vein there is some patency of this to the level of the knee with reflux but no gross reflux throughout his thigh.  The venous ulcer does not have any evidence of infection on the medial malleolus but is very slow in healing.  Impression and plan severe right leg venous hypertension with lesser hypertension in the left leg. I feel that the majority of his hypertension is related to the enlarged refluxing small saphenous vein. I feel that his great saphenous vein in all likelihood has a lesser intact of this. I would recommend ablation of his small  saphenous vein. He continues to have trouble he may require a right great saphenous vein ablation in the future. He will continue his local wound care until we can proceed with the procedure. He would like to schedule this as soon as possible. He understands this is an outpatient procedure under local anesthesia.

## 2013-03-12 ENCOUNTER — Encounter (INDEPENDENT_AMBULATORY_CARE_PROVIDER_SITE_OTHER): Payer: BC Managed Care – PPO

## 2013-03-12 DIAGNOSIS — I83009 Varicose veins of unspecified lower extremity with ulcer of unspecified site: Secondary | ICD-10-CM

## 2013-03-16 ENCOUNTER — Other Ambulatory Visit: Payer: Self-pay | Admitting: *Deleted

## 2013-03-16 ENCOUNTER — Telehealth: Payer: Self-pay | Admitting: Vascular Surgery

## 2013-03-16 DIAGNOSIS — I83019 Varicose veins of right lower extremity with ulcer of unspecified site: Secondary | ICD-10-CM

## 2013-03-16 DIAGNOSIS — M79609 Pain in unspecified limb: Secondary | ICD-10-CM

## 2013-03-16 DIAGNOSIS — I83029 Varicose veins of left lower extremity with ulcer of unspecified site: Secondary | ICD-10-CM

## 2013-03-16 MED ORDER — SILVER SULFADIAZINE 1 % EX CREA: 1.0000 "application " | TOPICAL_CREAM | Freq: Every day | CUTANEOUS | Status: DC

## 2013-03-16 NOTE — Telephone Encounter (Addendum)
Message copied by Fredrich Birks on Tue Mar 16, 2013  1:54 PM ------      Message from: Domenic Moras D      Created: Tue Mar 16, 2013 10:15 AM      Regarding: scheduling       Please schedule Alison Stalling for post laser ablation duplex (right leg, order in EPIC)  and VV FU with Dr. Arbie Cookey on 03-30-2013.  Thanks! ------  03/16/13: sent msg back to sonya, pt is scheduled 12/23 11:00am for lab and 11:45am TFE  dpm

## 2013-03-17 ENCOUNTER — Encounter (INDEPENDENT_AMBULATORY_CARE_PROVIDER_SITE_OTHER): Payer: BC Managed Care – PPO

## 2013-03-17 DIAGNOSIS — I83009 Varicose veins of unspecified lower extremity with ulcer of unspecified site: Secondary | ICD-10-CM

## 2013-03-23 ENCOUNTER — Encounter: Payer: Self-pay | Admitting: Vascular Surgery

## 2013-03-24 ENCOUNTER — Ambulatory Visit (INDEPENDENT_AMBULATORY_CARE_PROVIDER_SITE_OTHER): Payer: BC Managed Care – PPO | Admitting: Vascular Surgery

## 2013-03-24 ENCOUNTER — Encounter: Payer: Self-pay | Admitting: Vascular Surgery

## 2013-03-24 VITALS — BP 132/85 | HR 73 | Resp 18 | Ht 74.0 in | Wt 295.0 lb

## 2013-03-24 DIAGNOSIS — I83019 Varicose veins of right lower extremity with ulcer of unspecified site: Secondary | ICD-10-CM

## 2013-03-24 DIAGNOSIS — I83009 Varicose veins of unspecified lower extremity with ulcer of unspecified site: Secondary | ICD-10-CM

## 2013-03-24 HISTORY — PX: ENDOVENOUS ABLATION SAPHENOUS VEIN W/ LASER: SUR449

## 2013-03-24 NOTE — Progress Notes (Signed)
   Laser Ablation Procedure      Date: 03/24/2013    Adam Holland DOB:1980/05/24  Consent signed: Yes  Surgeon:T.F. Early  Procedure: Laser Ablation: right Small Saphenous Vein  BP 132/85  Pulse 73  Resp 18  Ht 6\' 2"  (1.88 m)  Wt 295 lb (133.811 kg)  BMI 37.86 kg/m2  Start time: 10:40AM   End time: 11:40AM  Tumescent Anesthesia: 425 cc 0.9% NaCl with 50 cc Lidocaine HCL with 1% Epi and 15 cc 8.4% NaHCO3  Local Anesthesia: 2 cc Lidocaine HCL and NaHCO3 (ratio 2:1)  Continuous Mode: 15 Watts Total Energy 1571 Joules Total Time1:44      Patient tolerated procedure well: Yes   Description of Procedure:  After marking the course of the saphenous vein and the secondary varicosities in the standing position, the patient was placed on the operating table in the prone position, and the right leg was prepped and draped in sterile fashion. Local anesthetic was administered, and under ultrasound guidance the saphenous vein was accessed with a micro needle and guide wire; then the micro puncture sheath was placed. A guide wire was inserted to the saphenopopliteal junction, followed by a 5 french sheath.  The position of the sheath and then the laser fiber below the junction was confirmed using the ultrasound and visualization of the aiming beam.  Tumescent anesthesia was administered along the course of the saphenous vein using ultrasound guidance. Protective laser glasses were placed on the patient, and the laser was fired at at 15 watt continuous mode.  For a total of 1571 joules.  A steri strip was applied to the puncture site.  ABD pads and thigh high compression stockings were applied.  Ace wrap bandages were applied over the phlebectomy sites and at the top of the saphenopopliteal junction.  Blood loss was less than 15 cc.  The patient ambulated out of the operating room having tolerated the procedure well.

## 2013-03-25 ENCOUNTER — Telehealth: Payer: Self-pay | Admitting: *Deleted

## 2013-03-25 NOTE — Telephone Encounter (Signed)
Left voice (phone) message to check on his post procedure status. Asked that he call me back this afternoon.

## 2013-03-25 NOTE — Telephone Encounter (Signed)
    03/25/2013  Time: 3:41 PM   Patient Name: Adam Holland  Patient of: T.F. Early  Procedure:Laser Ablation right small saphenous vein 03-24-2013  Reached patient at home and checked  His status  Yes    Comments/Actions Taken: [name] states that he has mild discomfort in right posterior calf (area that was ablated).  Encouraged him to keep right leg elevated when sitting, take Ibuprofen 600 mg three times a day (with meals) and use ice compress as needed for pain.  Reviewed post procedural instructions with him and reminded him of post LA duplex and VV FU with Dr. Arbie Cookey on 03-30-2013.     @SIGNATURE @

## 2013-03-26 ENCOUNTER — Encounter: Payer: Self-pay | Admitting: *Deleted

## 2013-03-29 ENCOUNTER — Encounter: Payer: Self-pay | Admitting: Vascular Surgery

## 2013-03-30 ENCOUNTER — Ambulatory Visit (HOSPITAL_COMMUNITY)
Admission: RE | Admit: 2013-03-30 | Discharge: 2013-03-30 | Disposition: A | Discharge status: Home / Self Care | Payer: BC Managed Care – PPO | Source: Ambulatory Visit | Attending: Vascular Surgery | Admitting: Vascular Surgery

## 2013-03-30 ENCOUNTER — Encounter: Payer: Self-pay | Admitting: *Deleted

## 2013-03-30 ENCOUNTER — Encounter: Payer: Self-pay | Admitting: Vascular Surgery

## 2013-03-30 ENCOUNTER — Ambulatory Visit (INDEPENDENT_AMBULATORY_CARE_PROVIDER_SITE_OTHER): Payer: BC Managed Care – PPO | Admitting: Vascular Surgery

## 2013-03-30 VITALS — BP 131/83 | HR 76 | Resp 16 | Ht 74.0 in | Wt 285.0 lb

## 2013-03-30 DIAGNOSIS — I83009 Varicose veins of unspecified lower extremity with ulcer of unspecified site: Secondary | ICD-10-CM

## 2013-03-30 DIAGNOSIS — I83019 Varicose veins of right lower extremity with ulcer of unspecified site: Secondary | ICD-10-CM

## 2013-03-30 DIAGNOSIS — I82819 Embolism and thrombosis of superficial veins of unspecified lower extremities: Secondary | ICD-10-CM | POA: Insufficient documentation

## 2013-03-30 DIAGNOSIS — M79609 Pain in unspecified limb: Secondary | ICD-10-CM

## 2013-03-30 NOTE — Progress Notes (Signed)
The patient presents today for followup of his laser ablation of his right small saphenous vein. He had minimal discomfort associated with the procedure and following. This was on 03/24/2013. He has had healing of his medial malleolus ulcer. He has been extremely compliant with his compression garments. He has a similar difficulty with ulceration on the medial ankle on his left leg. He does have reflux throughout his left great saphenous vein with dilatation throughout its course. I have recommended staged left great saphenous vein ablation.  Duplex today does reveal closure of his right small saphenous vein and no evidence of DVT. He will continue with elevation and compression and we will schedule him for left great saphenous vein ablation at his convenience

## 2013-04-14 ENCOUNTER — Other Ambulatory Visit: Payer: Self-pay | Admitting: *Deleted

## 2013-04-14 DIAGNOSIS — I83893 Varicose veins of bilateral lower extremities with other complications: Secondary | ICD-10-CM

## 2013-05-05 ENCOUNTER — Encounter: Payer: Self-pay | Admitting: Vascular Surgery

## 2013-05-06 ENCOUNTER — Ambulatory Visit (INDEPENDENT_AMBULATORY_CARE_PROVIDER_SITE_OTHER): Payer: BC Managed Care – PPO | Admitting: Vascular Surgery

## 2013-05-06 ENCOUNTER — Encounter: Payer: Self-pay | Admitting: Vascular Surgery

## 2013-05-06 VITALS — BP 151/86 | HR 68 | Resp 18 | Ht 72.0 in | Wt 280.0 lb

## 2013-05-06 DIAGNOSIS — I83893 Varicose veins of bilateral lower extremities with other complications: Secondary | ICD-10-CM

## 2013-05-06 HISTORY — PX: ENDOVENOUS ABLATION SAPHENOUS VEIN W/ LASER: SUR449

## 2013-05-06 NOTE — Progress Notes (Signed)
   Laser Ablation Procedure      Date: 05/06/2013    Adam Holland DOB:04/20/1980  Consent signed: Yes  Surgeon:T.F. Symone Cornman  Procedure: Laser Ablation: left Greater Saphenous Vein  BP 151/86  Pulse 68  Resp 18  Ht 6' (1.829 m)  Wt 280 lb (127.007 kg)  BMI 37.97 kg/m2  Start time: 8:45AM   End time: 9:50AM  Tumescent Anesthesia: 475 cc 0.9% NaCl with 50 cc Lidocaine HCL with 1% Epi and 15 cc 8.4% NaHCO3  Local Anesthesia: 3 cc Lidocaine HCL and NaHCO3 (ratio 2:1)  Continuous Mode: 15 Watts Total Energy 3009 Joules Total Time3:21       Patient tolerated procedure well: Yes    Description of Procedure:  After marking the course of the saphenous vein and the secondary varicosities in the standing position, the patient was placed on the operating table in the supine position, and the left leg was prepped and draped in sterile fashion. Local anesthetic was administered, and under ultrasound guidance the saphenous vein was accessed with a micro needle and guide wire; then the micro puncture sheath was placed. A guide wire was inserted to the saphenofemoral junction, followed by a 5 french sheath.  The position of the sheath and then the laser fiber below the junction was confirmed using the ultrasound and visualization of the aiming beam.  Tumescent anesthesia was administered along the course of the saphenous vein using ultrasound guidance. Protective laser glasses were placed on the patient, and the laser was fired at at 15 watt continuous mode.  For a total of 3009 joules.  A steri strip was applied to the puncture site.      ABD pads and thigh high compression stockings were applied.  Ace wrap bandages were applied over the phlebectomy sites and at the top of the saphenofemoral junction.  Blood loss was less than 15 cc.  The patient ambulated out of the operating room having tolerated the procedure well.

## 2013-05-07 ENCOUNTER — Telehealth: Payer: Self-pay | Admitting: *Deleted

## 2013-05-07 NOTE — Telephone Encounter (Signed)
    05/07/2013  Time: 10:31 AM   Patient Name: Adam Holland  Patient of: T.F. Early  Procedure:Laser Ablation left greater saphenous vein 05-06-2013  Reached patient at home and checked  His status  Yes    Comments/Actions Taken: [name] states that he is not experiencing left leg pain or swelling.  Reviewed post procedural instructions with him and reminded him of post laser ablation duplex and follow up appointment with Dr. Donnetta Hutching on 05-13-2013.  [name] states that ulcer on his right ankle is painful.  Advised him to continue to use Silvadene cream daily to right ankle ulcer and to wear compression hose on right and left legs.      @SIGNATURE @

## 2013-05-12 ENCOUNTER — Encounter: Payer: Self-pay | Admitting: Vascular Surgery

## 2013-05-13 ENCOUNTER — Telehealth: Payer: Self-pay | Admitting: *Deleted

## 2013-05-13 ENCOUNTER — Inpatient Hospital Stay (HOSPITAL_COMMUNITY): Admission: RE | Admit: 2013-05-13 | Payer: BC Managed Care – PPO | Source: Ambulatory Visit

## 2013-05-13 ENCOUNTER — Ambulatory Visit: Payer: BC Managed Care – PPO | Admitting: Vascular Surgery

## 2013-05-13 NOTE — Telephone Encounter (Signed)
Left voice message on pt.'s cell phone at 10:00AM regarding pt.s missed lab appt.(venous duplex) at 9:00AM this morning and FU appt. to see Dr. Donnetta Hutching at [facility].  Stressed the importance of coming in for the venous duplex post endovenous laser ablation to confirm that the vein is sealed/ closed and to evaluate for complications.  Requested that pt. call me to reschedule venous duplex and FU with Dr. Donnetta Hutching.  Left voice message on pt.s cell phone at 2:30PM rescheduling venous duplex/ FU appt. with Dr. Donnetta Hutching to 05-18-2013 at 3:30(lab)/ 4:00PM (appt. with Dr. Donnetta Hutching).  Asked that he call me to confirm he received the cell phone message and that he can make the rescheduled appts. on 05-18-2013.

## 2013-05-14 ENCOUNTER — Telehealth: Payer: Self-pay | Admitting: *Deleted

## 2013-05-14 NOTE — Telephone Encounter (Signed)
Adam Holland left voice message that his cell phone had been dropped and damaged so he had been unable to receive or make calls.  States he is able to come on 05-18-2013 for venous duplex and follow up appt.  Pt. Inquiring on the time on 05-18-2013 appts. and requests that I call him with the specific time on 05-18-2013 of ultrasound and follow up appt. with Dr. Donnetta Hutching.  Called Adam Holland and left voice message on his cell phone informing him that on 05-18-2013 his venous duplex is at 3:30PM and his follow up appt. with Dr. Donnetta Hutching is at 4:00PM.  Recommended that he come at 3:15PM to register and check in with the front desk on 05-18-2013.

## 2013-05-14 NOTE — Telephone Encounter (Signed)
Left voice message on pt's cell phone reminding him of rescheduled venous duplex and VV follow up appt. with Dr. Donnetta Hutching on 05-18-2013 at 3:30/4:00PM.  Stressed importance of venous duplex and FU appt. after endovenous laser ablation.  Pt. missed  05-13-2013 scheduled venous duplex and follow up appointment with Dr. Donnetta Hutching.

## 2013-05-17 ENCOUNTER — Encounter: Payer: Self-pay | Admitting: Vascular Surgery

## 2013-05-18 ENCOUNTER — Ambulatory Visit (HOSPITAL_COMMUNITY)
Admission: RE | Admit: 2013-05-18 | Discharge: 2013-05-18 | Disposition: A | Discharge status: Home / Self Care | Payer: BC Managed Care – PPO | Source: Ambulatory Visit | Attending: Vascular Surgery | Admitting: Vascular Surgery

## 2013-05-18 ENCOUNTER — Ambulatory Visit (INDEPENDENT_AMBULATORY_CARE_PROVIDER_SITE_OTHER): Payer: BC Managed Care – PPO | Admitting: Vascular Surgery

## 2013-05-18 ENCOUNTER — Encounter: Payer: Self-pay | Admitting: Vascular Surgery

## 2013-05-18 VITALS — BP 147/83 | HR 78 | Resp 18 | Ht 72.0 in | Wt 292.5 lb

## 2013-05-18 DIAGNOSIS — I83893 Varicose veins of bilateral lower extremities with other complications: Secondary | ICD-10-CM

## 2013-05-18 DIAGNOSIS — I82819 Embolism and thrombosis of superficial veins of unspecified lower extremities: Secondary | ICD-10-CM | POA: Insufficient documentation

## 2013-05-18 NOTE — Progress Notes (Signed)
Here today for followup of his staged bilateral laser ablation of great saphenous vein. The right small saphenous vein was ablated on 1214 and left great saphenous vein on 05/06/2013. He reports the typical amount of erythema over the left great saphenous area. This has resolved.  On physical exam he is a minimal bruising.  Venous duplex today reveals closure of his great saphenous vein from the insertion site the cath to 2 cm below the saphenofemoral junction.  Impression and plan: Stable staged bilateral venous ablation of superficial reflux. The patient does have known deep reflux. I again expressed critical importance of elevation and compression when possible. He will see [place] again on an as-needed basis

## 2013-05-25 ENCOUNTER — Encounter (HOSPITAL_COMMUNITY): Payer: BC Managed Care – PPO

## 2013-05-25 ENCOUNTER — Ambulatory Visit: Payer: BC Managed Care – PPO | Admitting: Vascular Surgery

## 2013-08-31 ENCOUNTER — Emergency Department (HOSPITAL_COMMUNITY)
Admission: EM | Admit: 2013-08-31 | Discharge: 2013-08-31 | Disposition: A | Discharge status: Home / Self Care | Payer: BC Managed Care – PPO | Attending: Emergency Medicine | Admitting: Emergency Medicine

## 2013-08-31 ENCOUNTER — Encounter (HOSPITAL_COMMUNITY): Payer: Self-pay | Admitting: Emergency Medicine

## 2013-08-31 DIAGNOSIS — Y9301 Activity, walking, marching and hiking: Secondary | ICD-10-CM | POA: Insufficient documentation

## 2013-08-31 DIAGNOSIS — K219 Gastro-esophageal reflux disease without esophagitis: Secondary | ICD-10-CM | POA: Insufficient documentation

## 2013-08-31 DIAGNOSIS — IMO0002 Reserved for concepts with insufficient information to code with codable children: Secondary | ICD-10-CM | POA: Insufficient documentation

## 2013-08-31 DIAGNOSIS — Z79899 Other long term (current) drug therapy: Secondary | ICD-10-CM | POA: Insufficient documentation

## 2013-08-31 DIAGNOSIS — X500XXA Overexertion from strenuous movement or load, initial encounter: Secondary | ICD-10-CM | POA: Insufficient documentation

## 2013-08-31 DIAGNOSIS — Z872 Personal history of diseases of the skin and subcutaneous tissue: Secondary | ICD-10-CM | POA: Insufficient documentation

## 2013-08-31 DIAGNOSIS — Z87891 Personal history of nicotine dependence: Secondary | ICD-10-CM | POA: Insufficient documentation

## 2013-08-31 DIAGNOSIS — S43499A Other sprain of unspecified shoulder joint, initial encounter: Secondary | ICD-10-CM | POA: Insufficient documentation

## 2013-08-31 DIAGNOSIS — Y929 Unspecified place or not applicable: Secondary | ICD-10-CM | POA: Insufficient documentation

## 2013-08-31 DIAGNOSIS — Z792 Long term (current) use of antibiotics: Secondary | ICD-10-CM | POA: Insufficient documentation

## 2013-08-31 DIAGNOSIS — Z791 Long term (current) use of non-steroidal anti-inflammatories (NSAID): Secondary | ICD-10-CM | POA: Insufficient documentation

## 2013-08-31 DIAGNOSIS — I10 Essential (primary) hypertension: Secondary | ICD-10-CM | POA: Insufficient documentation

## 2013-08-31 DIAGNOSIS — S46819A Strain of other muscles, fascia and tendons at shoulder and upper arm level, unspecified arm, initial encounter: Secondary | ICD-10-CM

## 2013-08-31 MED ORDER — TRAMADOL HCL 50 MG PO TABS
50.0000 mg | ORAL_TABLET | Freq: Once | ORAL | Status: DC
  Filled 2013-08-31: qty 1

## 2013-08-31 NOTE — ED Notes (Signed)
Medication held per EDP order due to pt driving home.

## 2013-08-31 NOTE — Discharge Instructions (Signed)
You likely have a strain of your trapezius muscle.  You can take ibuprofen 6-800 mg 3 times daily with food. Stop if you develop stomach irritation or bleeding. Rest, use ice/heat therapy to the muscle, and avoid heavy lifting. You should continue moving neck, back, and arms to keep them stretched. Follow up with a primary care doctor. Seek immediate care if you develop weakness, trouble walking, numbness/tingling, problems urinating or stooling, or other concerns.   Emergency Department Resource Guide 1) Find a Doctor and Pay Out of Pocket Although you won't have to find out who is covered by your insurance plan, it is a good idea to ask around and get recommendations. You will then need to call the office and see if the doctor you have chosen will accept you as a new patient and what types of options they offer for patients who are self-pay. Some doctors offer discounts or will set up payment plans for their patients who do not have insurance, but you will need to ask so you aren't surprised when you get to your appointment.  2) Contact Your Local Health Department Not all health departments have doctors that can see patients for sick visits, but many do, so it is worth a call to see if yours does. If you don't know where your local health department is, you can check in your phone book. The CDC also has a tool to help you locate your state's health department, and many state websites also have listings of all of their local health departments.  3) Find a [facility] If your illness is not likely to be very severe or complicated, you may want to try a walk in clinic. These are popping up all over the country in pharmacies, drugstores, and shopping centers. They're usually staffed by nurse practitioners or physician assistants that have been trained to treat common illnesses and complaints. They're usually fairly quick and inexpensive. However, if you have serious medical issues or chronic  medical problems, these are probably not your best option.  No Primary Care Doctor: - Call Health Connect at  [phone] - they can help you locate a primary care doctor that  accepts your insurance, provides certain services, etc. - Physician Referral Service- [phone]  Chronic Pain Problems: Organization         Address  Phone   Notes  [facility]  [phone] Patients need to be referred by their primary care doctor.   Medication Assistance: Organization         Address  Phone   Notes  [facility] Medication [facility] [address] [place], [place] [address] [phone] --Must be a resident of [facility] -- Must have NO insurance coverage whatsoever (no Medicaid/ Medicare, etc.) -- The pt. MUST have a primary care doctor that directs their care regularly and follows them in the community   [facility]  [phone]   [employer]  [phone]    Agencies that provide inexpensive medical care: Organization         Address  Phone   Notes  [place]  [phone]   [facility]    [phone]   [facility] [place], [place] [address] [phone]   [place] [address] [place] [phone]   [facility]    [phone]   [facility]    [phone]  [facility] [place]  [address], [place] Phone:  [phone], Fax:  [phone] Hours of Operation:  9 am - 6 pm, M-F.  Also accepts Medicaid/Medicare and self-pay.  [facility] [address], [address], [place] Phone: [phone], Fax: [phone]. Hours of Operation:  8:30 am - 5:30 pm, M-F.  Also accepts Medicaid and self-pay.  [facility] [address] [place] Phone: [phone]   [place], [place], [place] [phone],  Ext. [phone] Mondays & Thursdays: 7-9 AM.  First 15 patients are seen on a first come, first serve basis.    [place] Providers:  Organization         Address  Phone   Notes  [facility] [address], [place] [phone] Also accepts self-pay patients.  [facility] [address], [place]  [phone]   [place], [address], [place] [phone]   [facility] [address], [place] [phone]   Lucianne Lei [address], [place]   [phone] Only accepts [place] [facility] [place] patients after they have their name applied to their card.   Self-Pay (no insurance) in [facility]:  Organization         Address  Phone   Notes  Sickle Cell Patients, [facility] [place] [phone]   [facility] [place] [phone]   [facility] [place]  [place], [place], [place] [phone]   [facility]/Dr. Osei-Bonsu  [address] [place] or [address], [place] [phone] Phone number for both [place] and [place] locations is the same.  [facility] [address] [place] [phone]   [facility] [address], [place] or [address] [phone]   [facility] [address], [place] [phone], phone; [phone], fax Sees patients 1st and 3rd Saturday of every month.  Must not qualify for public or private insurance (i.e. Medicaid, Medicare, [place] Health Choice, Veterans' Benefits)  Household income should be no more than 200% of the poverty level The clinic cannot treat you if you are pregnant or think you are pregnant  Sexually transmitted diseases are not  treated at the clinic.

## 2013-08-31 NOTE — ED Provider Notes (Signed)
Patient seen/examined in the Emergency Department in conjunction with Resident Physician Provider  Patient reports back pain after heavy lifting Exam : awake/alert, no distress, ambulatory, no focal weakness noted in lower extremities Plan: stable for d/c home   Sharyon Cable, MD 08/31/13 [phone]

## 2013-08-31 NOTE — ED Provider Notes (Signed)
I have personally seen and examined the patient.  I have discussed the plan of care with the resident.  I have reviewed the documentation on PMH/FH/Soc. History.  I have reviewed the documentation of the resident and agree.   Sharyon Cable, MD 08/31/13 [phone]

## 2013-08-31 NOTE — ED Provider Notes (Signed)
CSN: [id number]     Arrival date & time 08/31/13  0706 History   First MD Initiated Contact with Patient 08/31/13 [phone]     Chief Complaint  Patient presents with  . Back Pain     Patient is a 33 y.o. male presenting with back pain.  Back Pain   33 y.o. male with h/o atrial fibrillation here with back pain. 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 He had to bend over quickly and awkwardly to catch him and felt a sudden sharp left upper back pain extending into the left side of his neck. He denies hearing a pop or tearing sound. Pain is 7/10 and worse with any arm movement, lifting, or neck movement to the left. He tried to work today but has had lots of pain. Denies swelling, weakness, tingling/numbness, or radiation of pain into chest or lower back. Denies gait difficulty, perineal numbness/tingling, or incontinence. Has not had prior back problems. Used aspercreme around 1AM today and 800mg  ibuprofen at 3AM today with short-term improvement.  Past Medical History  Diagnosis Date  . Hypertension   . Acid reflux   . Ulcer Oct. 20, 2014    Right foot and Left foot  . Irregular heart beat   . Varicose veins    Past Surgical History  Procedure Laterality Date  . Varicose vein surgery    . Cholecystectomy  Feb. 2014    Gall Bladder  . Endovenous ablation saphenous vein w/ laser Right 03-24-2013    right small saphenous vein by Curt Jews MD  . Endovenous ablation saphenous vein w/ laser Left 05-06-2013    left greater saphenous vein by Curt Jews MD   Family History  Problem Relation Age of Onset  . Deep vein thrombosis Father     Varicose Veins  . Heart attack Father    History  Substance Use Topics  . Smoking status: Former Smoker    Types: Cigarettes    Quit date: 04/08/1998  . Smokeless tobacco: Never Used  . Alcohol Use: Yes     Comment: occasional    Review of Systems  Musculoskeletal: Positive  for back pain.  All other systems reviewed and are negative.   Allergies  Oxycodone  Home Medications   Prior to Admission medications   Medication Sig Start Date End Date Taking? Authorizing Provider  acetaminophen (TYLENOL) 650 MG CR tablet Take 650 mg by mouth every 8 (eight) hours as needed.      Historical Provider, MD  aluminum & magnesium hydroxide-simethicone (MYLANTA) 500-450-40 MG/5ML suspension Take by mouth every 6 (six) hours as needed.      Historical Provider, MD  fexofenadine (ALLEGRA) 60 MG tablet Take 3 tablets (180 mg total) by mouth daily. 02/05/13   Kalman Drape, MD  furosemide (LASIX) 20 MG tablet Take 20 mg by mouth daily.    Historical Provider, MD  HYDROcodone-acetaminophen (NORCO/VICODIN) 5-325 MG per tablet Take 2 tablets by mouth every 4 (four) hours as needed for pain. 02/05/13   Kalman Drape, MD  ibuprofen (ADVIL,MOTRIN) 600 MG tablet Take 600 mg by mouth every 6 (six) hours as needed.    Historical Provider, MD  naproxen sodium (ANAPROX) 220 MG tablet Take 220 mg by mouth 2 (two) times daily with a meal.    Historical Provider, MD  ondansetron (ZOFRAN ODT) 8 MG disintegrating tablet Take 1 tablet (8 mg total) by mouth every  8 (eight) hours as needed for nausea. 02/05/13   Kalman Drape, MD  ranitidine (ZANTAC) 150 MG tablet Take 150 mg by mouth 2 (two) times daily.      Historical Provider, MD  silver sulfADIAZINE (SILVADENE) 1 % cream Apply 1 application topically daily. Apply to left leg ulcer as directed. 03/16/13   Rosetta Posner, MD  triamcinolone cream (KENALOG) 0.1 % Apply topically 2 (two) times daily. 02/05/13   Kalman Drape, MD   BP 150/79  Pulse 70  Temp(Src) 98.2 F (36.8 C) (Oral)  Resp 18  SpO2 99% Physical Exam GEN: NAD HEENT: Atraumatic, normocephalic, neck supple though moderate pain with leftward rotation, EOMI, sclera clear, PERRL CV: RRR, no murmurs, rubs, or gallops PULM: CTAB, normal effort SKIN: No rash or cyanosis; warm and  well-perfused PSYCH: Mood and affect euthymic, normal rate and volume of speech NEURO: Awake, alert, no focal deficits grossly, normal speech and gait, 5/5 upper extremity strength, CN grossly intact BACK: Moderate-severe left mid-trapezius tenderness to palpation, worse with biceps flexion against resistance and left shoulder internal rotation.  ED Course  Procedures (including critical care time) Labs Review Labs Reviewed - No data to display  Imaging Review No results found.   EKG Interpretation None      MDM   Final diagnoses:  Trapezius muscle strain   33 y.o. male with likely trapezius strain with localized pain at left upper back extending into left neck. No weakness, incontinence, or numbness/tingling. Disc herniation also possible but unlikely given no radicular symptoms.  - Rest, ice, ROM movement to prevent frozen shoulder, NSAIDs prn.  - Work note for 2 days off and 1 week lifting restrictions. - F/u with PCP. List given to establish care. - Return precautions and red flag symptoms reviewed. - Would MRI if develops any weakness or new symptoms. - No pain medication given recent ibuprofen and driving self home.   Hilton Sinclair, MD PGY-2, [place], MD 08/31/13 0800

## 2013-08-31 NOTE — ED Notes (Signed)
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 States that he tried OTC medication without much relief.

## 2013-09-06 ENCOUNTER — Emergency Department (HOSPITAL_COMMUNITY)
Admission: EM | Admit: 2013-09-06 | Discharge: 2013-09-06 | Disposition: A | Discharge status: Home / Self Care | Payer: Self-pay

## 2013-10-06 ENCOUNTER — Emergency Department (HOSPITAL_COMMUNITY): Payer: BC Managed Care – PPO

## 2013-10-06 ENCOUNTER — Encounter (HOSPITAL_COMMUNITY): Payer: Self-pay | Admitting: Emergency Medicine

## 2013-10-06 ENCOUNTER — Emergency Department (HOSPITAL_COMMUNITY)
Admission: EM | Admit: 2013-10-06 | Discharge: 2013-10-06 | Disposition: A | Discharge status: Home / Self Care | Payer: BC Managed Care – PPO | Attending: Emergency Medicine | Admitting: Emergency Medicine

## 2013-10-06 DIAGNOSIS — Z792 Long term (current) use of antibiotics: Secondary | ICD-10-CM | POA: Insufficient documentation

## 2013-10-06 DIAGNOSIS — Z9283 Personal history of failed moderate sedation: Secondary | ICD-10-CM | POA: Insufficient documentation

## 2013-10-06 DIAGNOSIS — L97511 Non-pressure chronic ulcer of other part of right foot limited to breakdown of skin: Secondary | ICD-10-CM

## 2013-10-06 DIAGNOSIS — Z791 Long term (current) use of non-steroidal anti-inflammatories (NSAID): Secondary | ICD-10-CM | POA: Insufficient documentation

## 2013-10-06 DIAGNOSIS — I10 Essential (primary) hypertension: Secondary | ICD-10-CM | POA: Insufficient documentation

## 2013-10-06 DIAGNOSIS — Z8719 Personal history of other diseases of the digestive system: Secondary | ICD-10-CM | POA: Insufficient documentation

## 2013-10-06 DIAGNOSIS — Z87891 Personal history of nicotine dependence: Secondary | ICD-10-CM | POA: Insufficient documentation

## 2013-10-06 DIAGNOSIS — L97509 Non-pressure chronic ulcer of other part of unspecified foot with unspecified severity: Secondary | ICD-10-CM | POA: Insufficient documentation

## 2013-10-06 MED ORDER — CEPHALEXIN 250 MG PO CAPS
500.0000 mg | ORAL_CAPSULE | Freq: Once | ORAL | Status: AC
  Administered 2013-10-06: 500 mg via ORAL
  Filled 2013-10-06: qty 2

## 2013-10-06 MED ORDER — HYDROCODONE-ACETAMINOPHEN 5-325 MG PO TABS
2.0000 | ORAL_TABLET | Freq: Once | ORAL | Status: AC
  Administered 2013-10-06: 2 via ORAL
  Filled 2013-10-06: qty 2

## 2013-10-06 MED ORDER — CEPHALEXIN 500 MG PO CAPS: 500.0000 mg | ORAL_CAPSULE | Freq: Four times a day (QID) | ORAL | Status: DC

## 2013-10-06 MED ORDER — HYDROCODONE-ACETAMINOPHEN 5-325 MG PO TABS: 1.0000 | ORAL_TABLET | ORAL | Status: DC | PRN

## 2013-10-06 NOTE — Discharge Instructions (Signed)
Skin Ulcer  A skin ulcer is an open sore that can be shallow or deep. Skin ulcers sometimes become infected and are difficult to treat. It may be 1 month or longer before real healing progress is made.  CAUSES    Injury.   Problems with the veins or arteries.   Diabetes.   Insect bites.   Bedsores.   Inflammatory conditions.  SYMPTOMS    Pain, redness, swelling, and tenderness around the ulcer.   Fever.   Bleeding from the ulcer.   Yellow or clear fluid coming from the ulcer.  DIAGNOSIS   There are many types of skin ulcers. Any open sores will be examined. Certain tests will be done to determine the kind of ulcer you have. The right treatment depends on the type of ulcer you have.  TREATMENT   Treatment is a long-term challenge. It may include:   Wearing an elastic wrap, compression stockings, or gel cast over the ulcer area.   Taking antibiotic medicines or putting antibiotic creams on the affected area if there is an infection.  HOME CARE INSTRUCTIONS   Put on your bandages (dressings), wraps, or casts over the ulcer as directed by your caregiver.   Change all dressings as directed by your caregiver.   Take all medicines as directed by your caregiver.   Keep the affected area clean and dry.   Avoid injuries to the affected area.   Eat a well-balanced, healthy diet that includes plenty of fruit and vegetables.   If you smoke, consider quitting or decreasing the amount of cigarettes you smoke.   Once the ulcer heals, get regular exercise as directed by your caregiver.   Work with your caregiver to make sure your blood pressure, cholesterol, and diabetes are well-controlled.   Keep your skin moisturized. Dry skin can crack and lead to skin ulcers.  SEEK IMMEDIATE MEDICAL CARE IF:    Your pain gets worse.   You have swelling, redness, or fluids around the ulcer.   You have chills.   You have a fever.  MAKE SURE YOU:    Understand these instructions.   Will watch your condition.   Will get  help right away if you are not doing well or get worse.  Document Released: 05/02/2004 Document Revised: 06/17/2011 Document Reviewed: 11/09/2010  ExitCare Patient Information 2015 [employer]. This information is not intended to replace advice given to you by your health care provider. Make sure you discuss any questions you have with your health care provider.

## 2013-10-06 NOTE — ED Provider Notes (Signed)
Medical screening examination/treatment/procedure(s) were performed by non-physician practitioner and as supervising physician I was immediately available for consultation/collaboration.   EKG Interpretation None        Merryl Hacker, MD 10/06/13 [phone]

## 2013-10-06 NOTE — ED Provider Notes (Signed)
CSN: [id number]     Arrival date & time 10/06/13  0631 History   First MD Initiated Contact with Patient 10/06/13 0654     Chief Complaint  Patient presents with  . Leg Pain     (Consider location/radiation/quality/duration/timing/severity/associated sxs/prior Treatment) Patient is a 33 y.o. male presenting with leg pain. The history is provided by the patient. No language interpreter was used.  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 No known injury. He has been treating an ulceration located on the medial proximal foot with a topical zinc-oxide preparation for the past one month. No drainage. He reports increasing pain to the area, where the pain extended into the distal foot last night with increased swelling. No fever. No calf pain. No history of DVT.   Past Medical History  Diagnosis Date  . Hypertension   . Acid reflux   . Ulcer Oct. 20, 2014    Right foot and Left foot  . Irregular heart beat   . Varicose veins    Past Surgical History  Procedure Laterality Date  . Varicose vein surgery    . Cholecystectomy  Feb. 2014    Gall Bladder  . Endovenous ablation saphenous vein w/ laser Right 03-24-2013    right small saphenous vein by Curt Jews MD  . Endovenous ablation saphenous vein w/ laser Left 05-06-2013    left greater saphenous vein by Curt Jews MD   Family History  Problem Relation Age of Onset  . Deep vein thrombosis Father     Varicose Veins  . Heart attack Father    History  Substance Use Topics  . Smoking status: Former Smoker    Types: Cigarettes    Quit date: 04/08/1998  . Smokeless tobacco: Never Used  . Alcohol Use: Yes     Comment: occasional    Review of Systems  Constitutional: Negative for fever.  Musculoskeletal:       See HPI.  Skin: Positive for wound.  Neurological: Negative for  numbness.      Allergies  Oxycodone  Home Medications   Prior to Admission medications   Medication Sig Start Date End Date Taking? Authorizing Provider  ibuprofen (ADVIL,MOTRIN) 600 MG tablet Take 600 mg by mouth every 6 (six) hours as needed for mild pain.    Yes Historical Provider, MD  naproxen sodium (ANAPROX) 220 MG tablet Take 220 mg by mouth 2 (two) times daily with a meal.   Yes Historical Provider, MD  silver sulfADIAZINE (SILVADENE) 1 % cream Apply 1 application topically daily as needed (rash).   Yes Historical Provider, MD   BP 145/82  Temp(Src) 98.1 F (36.7 C) (Oral)  Resp 19  Ht 6\' 2"  (1.88 m)  Wt 290 lb (131.543 kg)  BMI 37.22 kg/m2  SpO2 99% Physical Exam  Constitutional: He is oriented to person, place, and time. He appears well-developed and well-nourished.  Neck: Normal range of motion.  Pulmonary/Chest: Effort normal.  Musculoskeletal: Normal range of motion.  Right foot somewhat swollen distal to superficial ulceration of medial proximal foot. Ulceration is tender with mild tenderness only of the distal foot. Mild erythema.   Neurological: He is alert and oriented to person, place, and time.  Skin: Skin is warm and dry.  Psychiatric: He has a normal mood and affect.    ED Course  Procedures (including critical care time) Labs Review Labs Reviewed - No data to display  Imaging Review No results found.   EKG Interpretation None      MDM   Final diagnoses:  None    1. Right foot ulceration  No evidence osteomyelitis on plain film. Foot is found to be swollen, mildly erythematous but no purulence of ulceration or warmth to the touch. Given patient's history of PVD, will treat with antibiotics and recommend close follow up for recheck.     Dewaine Oats, PA-C 10/06/13 [phone]

## 2013-10-06 NOTE — ED Notes (Signed)
Pt reports that he began having right leg pain this morning at 2:00. Pt reports hx of poor circulation in both legs. Pt reports that he had "laser surgery" on his right leg in January. NAD at this time.

## 2013-10-30 ENCOUNTER — Encounter (HOSPITAL_COMMUNITY): Payer: Self-pay | Admitting: Emergency Medicine

## 2013-10-30 ENCOUNTER — Emergency Department (HOSPITAL_COMMUNITY): Payer: BC Managed Care – PPO

## 2013-10-30 ENCOUNTER — Emergency Department (HOSPITAL_COMMUNITY)
Admission: EM | Admit: 2013-10-30 | Discharge: 2013-10-30 | Disposition: A | Discharge status: Home / Self Care | Payer: BC Managed Care – PPO | Attending: Emergency Medicine | Admitting: Emergency Medicine

## 2013-10-30 DIAGNOSIS — R079 Chest pain, unspecified: Secondary | ICD-10-CM

## 2013-10-30 DIAGNOSIS — E663 Overweight: Secondary | ICD-10-CM | POA: Insufficient documentation

## 2013-10-30 DIAGNOSIS — I10 Essential (primary) hypertension: Secondary | ICD-10-CM | POA: Insufficient documentation

## 2013-10-30 DIAGNOSIS — Z79899 Other long term (current) drug therapy: Secondary | ICD-10-CM | POA: Insufficient documentation

## 2013-10-30 DIAGNOSIS — Z87891 Personal history of nicotine dependence: Secondary | ICD-10-CM | POA: Insufficient documentation

## 2013-10-30 DIAGNOSIS — Z8719 Personal history of other diseases of the digestive system: Secondary | ICD-10-CM | POA: Insufficient documentation

## 2013-10-30 HISTORY — DX: Other specified disorders of veins: I87.8

## 2013-10-30 LAB — CBC
HEMATOCRIT: 39.6 % (ref 39.0–52.0)
Hemoglobin: 12.7 g/dL — ABNORMAL LOW (ref 13.0–17.0)
MCH: 27.3 pg (ref 26.0–34.0)
MCHC: 32.1 g/dL (ref 30.0–36.0)
MCV: 85 fL (ref 78.0–100.0)
PLATELETS: 191 10*3/uL (ref 150–400)
RBC: 4.66 MIL/uL (ref 4.22–5.81)
RDW: 14.8 % (ref 11.5–15.5)
WBC: 9.2 10*3/uL (ref 4.0–10.5)

## 2013-10-30 LAB — I-STAT TROPONIN, ED
Troponin i, poc: 0 ng/mL (ref 0.00–0.08)
Troponin i, poc: 0 ng/mL (ref 0.00–0.08)

## 2013-10-30 LAB — BASIC METABOLIC PANEL
ANION GAP: 14 (ref 5–15)
BUN: 12 mg/dL (ref 6–23)
CALCIUM: 9 mg/dL (ref 8.4–10.5)
CO2: 25 mEq/L (ref 19–32)
Chloride: 104 mEq/L (ref 96–112)
Creatinine, Ser: 1.01 mg/dL (ref 0.50–1.35)
GFR calc non Af Amer: >90 mL/min (ref >90)
Glucose, Bld: 109 mg/dL — ABNORMAL HIGH (ref 70–99)
Potassium: 3.7 mEq/L (ref 3.7–5.3)
Sodium: 143 mEq/L (ref 137–147)

## 2013-10-30 LAB — PRO B NATRIURETIC PEPTIDE: Pro B Natriuretic peptide (BNP): 30.6 pg/mL (ref 0–125)

## 2013-10-30 MED ORDER — ASPIRIN 81 MG PO CHEW
324.0000 mg | CHEWABLE_TABLET | Freq: Once | ORAL | Status: AC
  Administered 2013-10-30: 324 mg via ORAL
  Filled 2013-10-30: qty 4

## 2013-10-30 NOTE — ED Notes (Signed)
Pt alert, NAD, calm, interactive, resps e/u, speaking in clear complete sentences, no dyspnea noted, VSS, pt updated. Pending 2nd troponin.

## 2013-10-30 NOTE — ED Notes (Signed)
Pt. reports intermittent left chest pain with SOB worse with exertion and movement onset 7 pm this evening , denies nausea or diaphoresis .

## 2013-10-30 NOTE — Discharge Instructions (Signed)

## 2013-10-30 NOTE — ED Notes (Addendum)
Lab finished at [facility]. 2nd troponin drawn, result pending.

## 2013-10-30 NOTE — ED Notes (Signed)
Follow up instructions reviewed with pt. Pt verbalized understanding.

## 2013-10-30 NOTE — ED Provider Notes (Signed)
CSN: [id number]     Arrival date & time 10/30/13  0129 History   First MD Initiated Contact with Patient 10/30/13 0232     Chief Complaint  Patient presents with  . Chest Pain     (Consider location/radiation/quality/duration/timing/severity/associated sxs/prior Treatment) HPI  This is a 33 yo old male with a history of hypertension and atrial fibrillation who presents with shortness of breath and chest pain. Onset was 7 PM. Patient states that he was upstairs working. Chest pain was worsened with movement. He describes as pressure. Currently chest pain-free. He had associated shortness of breath. He denies any recent travel, history of blood clots. Denies any history of coronary artery disease. He is not a current smoker. He denies any recent fevers or cough.  Past Medical History  Diagnosis Date  . Hypertension   . Acid reflux   . Ulcer Oct. 20, 2014    Right foot and Left foot  . Irregular heart beat   . Varicose veins   . Venous stasis    Past Surgical History  Procedure Laterality Date  . Varicose vein surgery    . Cholecystectomy  Feb. 2014    Gall Bladder  . Endovenous ablation saphenous vein w/ laser Right 03-24-2013    right small saphenous vein by Curt Jews MD  . Endovenous ablation saphenous vein w/ laser Left 05-06-2013    left greater saphenous vein by Curt Jews MD   Family History  Problem Relation Age of Onset  . Deep vein thrombosis Father     Varicose Veins  . Heart attack Father    History  Substance Use Topics  . Smoking status: Former Smoker    Types: Cigarettes    Quit date: 04/08/1998  . Smokeless tobacco: Never Used  . Alcohol Use: Yes     Comment: occasional    Review of Systems  Constitutional: Negative.  Negative for fever.  Respiratory: Positive for chest tightness and shortness of breath. Negative for cough.   Cardiovascular: Positive for chest pain. Negative for leg swelling.  Gastrointestinal: Negative.  Negative for nausea,  vomiting and abdominal pain.  Genitourinary: Negative.  Negative for dysuria.  Musculoskeletal: Negative for back pain.  All other systems reviewed and are negative.     Allergies  Oxycodone  Home Medications   Prior to Admission medications   Medication Sig Start Date End Date Taking? Authorizing Provider  ibuprofen (ADVIL,MOTRIN) 600 MG tablet Take 600 mg by mouth every 6 (six) hours as needed for mild pain.    Yes Historical Provider, MD  Multiple Vitamins-Minerals (MULTIVITAMIN WITH MINERALS) tablet Take 1 tablet by mouth daily. gummy   Yes Historical Provider, MD  naproxen sodium (ANAPROX) 220 MG tablet Take 220 mg by mouth 2 (two) times daily with a meal.   Yes Historical Provider, MD   BP 114/79  Pulse 54  Temp(Src) 98 F (36.7 C) (Oral)  Resp 13  Ht 6\' 2"  (1.88 m)  Wt 295 lb (133.811 kg)  BMI 37.86 kg/m2  SpO2 96% Physical Exam  Nursing note and vitals reviewed. Constitutional: He is oriented to person, place, and time. He appears well-developed and well-nourished.  Overweight  HENT:  Head: Normocephalic and atraumatic.  Cardiovascular: Normal rate, regular rhythm and normal heart sounds.   No murmur heard. Pulmonary/Chest: Effort normal and breath sounds normal. No respiratory distress. He has no wheezes.  Abdominal: Soft. Bowel sounds are normal. There is no tenderness. There is no rebound.  Musculoskeletal: He exhibits no  edema.  Neurological: He is alert and oriented to person, place, and time.  Skin: Skin is warm and dry.  Psychiatric: He has a normal mood and affect.    ED Course  Procedures (including critical care time) Labs Review Labs Reviewed  CBC - Abnormal; Notable for the following:    Hemoglobin 12.7 (*)    All other components within normal limits  BASIC METABOLIC PANEL - Abnormal; Notable for the following:    Glucose, Bld 109 (*)    All other components within normal limits  PRO B NATRIURETIC PEPTIDE  I-STAT TROPOININ, ED  I-STAT  TROPOININ, ED    Imaging Review Dg Chest 2 View  10/30/2013   CLINICAL DATA:  CHEST PAIN  EXAM: CHEST  2 VIEW  COMPARISON:  Prior radiograph from 05/20/2012  FINDINGS: The cardiac and mediastinal silhouettes are stable in size and contour, and remain within normal limits.  The lungs are normally inflated. Hazy infiltrate present within the left lower lobe, suspicious for possible pneumonia. No pulmonary edema or pleural effusion. No pneumothorax.  No acute osseous abnormality identified.  IMPRESSION: Hazy left lower lobe infiltrate, suspicious for pneumonia.   Electronically Signed   By: Jeannine Boga M.D.   On: 10/30/2013 03:49     EKG Interpretation   Date/Time:  Saturday October 30 2013 01:32:22 EDT Ventricular Rate:  73 PR Interval:  148 QRS Duration: 98 QT Interval:  370 QTC Calculation: 407 R Axis:   30 Text Interpretation:  Normal sinus rhythm Normal ECG Confirmed by Kimon Loewen   MD, [place] Revoir ([id number]) on 10/30/2013 2:18:25 AM      MDM   Final diagnoses:  Chest pain, unspecified chest pain type    Patient presents with shortness of breath and chest pain. Onset 7 PM. He is currently pain-free. History of hypertension and atrial fibrillation. Otherwise low risk for coronary artery disease. Patient was given full dose aspirin. Basic labwork was obtained. Initial troponin is negative. EKG is nonischemic. Chest x-ray shows a hazy left lower lobe infiltrate. Patient denies any recent symptoms of fever, cough, and has a normal white count. Have low suspicion clinically for pneumonia.  Patient PERC neg.  repeat troponin is also negative. Discuss with patient negative workup. Given that he is low risk for ACS, I feel he could be discharged home and followup with his primary physician. He was given strict return precautions.  After history, exam, and medical workup I feel the patient has been appropriately medically screened and is safe for discharge home. Pertinent diagnoses were discussed  with the patient. Patient was given return precautions.     Merryl Hacker, MD 10/30/13 [phone]

## 2014-07-29 NOTE — Op Note (Signed)
PATIENT NAME:  Adam Holland, TRUXILLO MR#:  [id number] DATE OF BIRTH:  1980-12-16  DATE OF PROCEDURE:  05/15/2012  ATTENDING SURGEON: Marlyce Huge, MD.  PREOPERATIVE DIAGNOSIS:   POSTOPERATIVE DIAGNOSIS:   PROCEDURE PERFORMED: 1. Laparoscopic lysis of adhesions 2. Laparoscopic cholecystectomy.   ANESTHESIA: General.   ESTIMATED BLOOD LOSS: 25 mL.   SPECIMENS: Gallbladder.   COMPLICATIONS: None.   INDICATION FOR SURGERY: The patient is a pleasant 34 year old male with a history of right upper quadrant pain, leukocytosis, and obstructing gallstone on ultrasound and he was brought in today for a gallbladder removal.   DETAILS OF PROCEDURE: Informed consent was obtained. He was then brought to the Operating Room suite. He was induced. Endotracheal tube was placed, general anesthesia was administered, and his abdomen was then prepped and draped in standard surgical fashion. A time-out was then performed correctly identifying the patient name, operative site, and procedure to be performed. A supraumbilical incision was made transversely. It was dissected down to the fascia. The fascia was incised. A sterile finger was placed into the abdomen to ensure that there were no adhesions. Two stay sutures were placed through the fascia. A Hasson trocar was then placed in the abdomen and the abdomen was insufflated. A 10-mm, 30-degrees scope was placed through the trocar, and there were large amounts of adhesions between the omentum and the anterior abdominal wall. An 11-mm trocar was placed in the subxiphoid abdominal wall. These adhesions to the abdominal wall were then taken down for greater than 30 minutes with a hot scissors. Once I was able to clear a nice portion of the anterior abdominal wall and lateral abdominal wall, I then placed two 5-mm trocars at the midclavicular and anterior axillary line in the subcostal position. The gallbladder was then retracted over the dome of the liver. There was a  large amount of adhesions between the omentum and the liver and the gallbladder. These were taken down carefully with blunt dissection and Bovie electrocautery. The infundibulum was then retracted laterally with the trocar. The cystic duct and cystic artery were dissected out. The critical view was obtained. These structures were then clipped thricely and ligated. The gallbladder was then taken off the gallbladder fossa with Bovie electrocautery. The gallbladder was then taken out with an EndoCatch bag through the supraumbilical port site. The electrocautery was used to provide hemostasis. There was some oozing from the gallbladder fossa and Surgicel was placed in the gallbladder fossa. Hemostasis did appear to be obtained. The abdomen was then irrigated. The trocars were then taken out under direct visualization. The previously placed stay sutures were tied together to close the supraumbilical port site fascia. The skin was then closed with interrupted 4-0 Monocryl sutures. Steri-Strips and Telfa gauze and Tegaderm were then used to complete the dressing. The patient was then awoken, extubated and brought to the Postanesthesia Care Unit. There were no immediate complications. Needle, sponge, and instrument counts were correct at the end of the procedure.   ____________________________ Glena Norfolk. Aroura Vasudevan, MD cal:jm D: 05/15/2012 17:31:12 ET T: 05/16/2012 13:31:39 ET JOB#: [id number]  cc: Harrell Gave A. Storm Dulski, MD, <Dictator> Floyde Parkins MD ELECTRONICALLY SIGNED 05/17/2012 10:00

## 2015-02-15 ENCOUNTER — Encounter (HOSPITAL_COMMUNITY): Payer: Self-pay | Admitting: Emergency Medicine

## 2015-02-15 ENCOUNTER — Emergency Department (HOSPITAL_COMMUNITY)
Admission: EM | Admit: 2015-02-15 | Discharge: 2015-02-15 | Disposition: A | Discharge status: Home / Self Care | Payer: Self-pay | Attending: Emergency Medicine | Admitting: Emergency Medicine

## 2015-02-15 DIAGNOSIS — R350 Frequency of micturition: Secondary | ICD-10-CM | POA: Insufficient documentation

## 2015-02-15 DIAGNOSIS — Z87891 Personal history of nicotine dependence: Secondary | ICD-10-CM | POA: Insufficient documentation

## 2015-02-15 DIAGNOSIS — I10 Essential (primary) hypertension: Secondary | ICD-10-CM | POA: Insufficient documentation

## 2015-02-15 DIAGNOSIS — Z8719 Personal history of other diseases of the digestive system: Secondary | ICD-10-CM | POA: Insufficient documentation

## 2015-02-15 DIAGNOSIS — R7303 Prediabetes: Secondary | ICD-10-CM | POA: Insufficient documentation

## 2015-02-15 LAB — CBC
HCT: 43.9 % (ref 39.0–52.0)
Hemoglobin: 14.2 g/dL (ref 13.0–17.0)
MCH: 27.3 pg (ref 26.0–34.0)
MCHC: 32.3 g/dL (ref 30.0–36.0)
MCV: 84.4 fL (ref 78.0–100.0)
PLATELETS: 203 10*3/uL (ref 150–400)
RBC: 5.2 MIL/uL (ref 4.22–5.81)
RDW: 14.3 % (ref 11.5–15.5)
WBC: 9.4 10*3/uL (ref 4.0–10.5)

## 2015-02-15 LAB — CBG MONITORING, ED
Glucose-Capillary: 130 mg/dL — ABNORMAL HIGH (ref 65–99)
Glucose-Capillary: 123 mg/dL — ABNORMAL HIGH (ref 65–99)

## 2015-02-15 LAB — URINALYSIS, ROUTINE W REFLEX MICROSCOPIC
BILIRUBIN URINE: NEGATIVE
Glucose, UA: NEGATIVE mg/dL
Hgb urine dipstick: NEGATIVE
Ketones, ur: NEGATIVE mg/dL
Leukocytes, UA: NEGATIVE
Nitrite: NEGATIVE
PH: 6 (ref 5.0–8.0)
PROTEIN: NEGATIVE mg/dL
Specific Gravity, Urine: 1.022 (ref 1.005–1.030)
UROBILINOGEN UA: 0.2 mg/dL (ref 0.0–1.0)

## 2015-02-15 LAB — BASIC METABOLIC PANEL
Anion gap: 11 (ref 5–15)
BUN: <5 mg/dL — ABNORMAL LOW (ref 6–20)
CALCIUM: 9.2 mg/dL (ref 8.9–10.3)
CO2: 25 mmol/L (ref 22–32)
Chloride: 105 mmol/L (ref 101–111)
Creatinine, Ser: 1.1 mg/dL (ref 0.61–1.24)
Glucose, Bld: 142 mg/dL — ABNORMAL HIGH (ref 65–99)
Potassium: 3.6 mmol/L (ref 3.5–5.1)
Sodium: 141 mmol/L (ref 135–145)

## 2015-02-15 NOTE — ED Notes (Signed)
The patient says he has been having to go "pee alot". He also says he is extremely thirsty.  He is not complaining of anything else other than a headache and tingling in his hands.  The last time he felt  like this he was told he was prediabetic.

## 2015-02-15 NOTE — Discharge Instructions (Signed)
Prediabetes Eating Plan Prediabetes--also called impaired glucose tolerance or impaired fasting glucose--is a condition that causes blood sugar (blood glucose) levels to be higher than normal. Following a healthy diet can help to keep prediabetes under control. It can also help to lower the risk of type 2 diabetes and heart disease, which are increased in people who have prediabetes. Along with regular exercise, a healthy diet:  Promotes weight loss.  Helps to control blood sugar levels.  Helps to improve the way that the body uses insulin. WHAT DO I NEED TO KNOW ABOUT THIS EATING PLAN?  Use the glycemic index (GI) to plan your meals. The index tells you how quickly a food will raise your blood sugar. Choose low-GI foods. These foods take a longer time to raise blood sugar.  Pay close attention to the amount of carbohydrates in the food that you eat. Carbohydrates increase blood sugar levels.  Keep track of how many calories you take in. Eating the right amount of calories will help you to achieve a healthy weight. Losing about 7 percent of your starting weight can help to prevent type 2 diabetes.  You may want to follow a Mediterranean diet. This diet includes a lot of vegetables, lean meats or fish, whole grains, fruits, and healthy oils and fats. WHAT FOODS CAN I EAT? Grains Whole grains, such as whole-wheat or whole-grain breads, crackers, cereals, and pasta. Unsweetened oatmeal. Bulgur. Barley. Quinoa. Brown rice. Corn or whole-wheat flour tortillas or taco shells. Vegetables Lettuce. Spinach. Peas. Beets. Cauliflower. Cabbage. Broccoli. Carrots. Tomatoes. Squash. Eggplant. Herbs. Peppers. Onions. Cucumbers. Brussels sprouts. Fruits Berries. Bananas. Apples. Oranges. Grapes. Papaya. Mango. Pomegranate. Kiwi. Grapefruit. Cherries. Meats and Other Protein Sources Seafood. Lean meats, such as chicken and turkey or lean cuts of pork and beef. Tofu. Eggs. Nuts. Beans. Dairy Low-fat or  fat-free dairy products, such as yogurt, cottage cheese, and cheese. Beverages Water. Tea. Coffee. Sugar-free or diet soda. Seltzer water. Milk. Milk alternatives, such as soy or almond milk. Condiments Mustard. Relish. Low-fat, low-sugar ketchup. Low-fat, low-sugar barbecue sauce. Low-fat or fat-free mayonnaise. Sweets and Desserts Sugar-free or low-fat pudding. Sugar-free or low-fat ice cream and other frozen treats. Fats and Oils Avocado. Walnuts. Olive oil. The items listed above may not be a complete list of recommended foods or beverages. Contact your dietitian for more options.  WHAT FOODS ARE NOT RECOMMENDED? Grains Refined white flour and flour products, such as bread, pasta, snack foods, and cereals. Beverages Sweetened drinks, such as sweet iced tea and soda. Sweets and Desserts Baked goods, such as cake, cupcakes, pastries, cookies, and cheesecake. The items listed above may not be a complete list of foods and beverages to avoid. Contact your dietitian for more information.   This information is not intended to replace advice given to you by your health care provider. Make sure you discuss any questions you have with your health care provider.   Document Released: 08/09/2014 Document Reviewed: 08/09/2014 Elsevier Interactive Patient Education 2016 [employer]  

## 2015-02-15 NOTE — ED Notes (Signed)
Pt concerned about that possiblity that he has diabetes. States he urinates every 30 minutes, has been drinking extensively x 1 week. Reports family history of diabetes. States he does not have a PCP.

## 2015-02-15 NOTE — ED Provider Notes (Signed)
CSN: [id number]     Arrival date & time 02/15/15  1801 History   First MD Initiated Contact with Patient 02/15/15 2006     Chief Complaint  Patient presents with  . Urinary Frequency    The patient says he has been having to go "pee alot". He also says he is extremely thirsty.  He is not complaining of anything else other than a headache and tingling in his hands.  The last time he felt  like this he was told he was prediabetic     (Consider location/radiation/quality/duration/timing/severity/associated sxs/prior Treatment) HPI   PCP: No PCP Per Patient PMH: Hypertension, acid reflux, ulcer, irregular heart beat, varicose veins, venous stasis  Adam Holland is a 34 y.o.  male  The patient to the ER for evaluation of having to urinate more frequently and increased thirst for the past week. He says he is having to urinate every 30 minutes. He was told that he is a prediabetic and it runs in his family therefore he is concerned that he may be a diabetic. He endorses not having a good diet, eating carbs, sodium and soda because money is low and those medications are cheaper. He denies dysuria, diaphoresis, hot flashes, back pain, headache, confusion, vomiting, weakness, SOB, abdominal pain, swelling, rash, CP.   Past Medical History  Diagnosis Date  . Hypertension   . Acid reflux   . Ulcer Oct. 20, 2014    Right foot and Left foot  . Irregular heart beat   . Varicose veins   . Venous stasis    Past Surgical History  Procedure Laterality Date  . Varicose vein surgery    . Cholecystectomy  Feb. 2014    Gall Bladder  . Endovenous ablation saphenous vein w/ laser Right 03-24-2013    right small saphenous vein by Curt Jews MD  . Endovenous ablation saphenous vein w/ laser Left 05-06-2013    left greater saphenous vein by Curt Jews MD   Family History  Problem Relation Age of Onset  . Deep vein thrombosis Father     Varicose Veins  . Heart attack Father    Social History   Substance Use Topics  . Smoking status: Former Smoker    Types: Cigarettes    Quit date: 04/08/1998  . Smokeless tobacco: Never Used  . Alcohol Use: Yes     Comment: occasional    Review of Systems  ROS: See HPI Constitutional: no fever  Eyes: no drainage  ENT: no runny nose  Cardiovascular: no chest pain  Resp: no SOB  GI: no vomiting GU: no dysuria Integumentary: no rash  Allergy: no hives  Musculoskeletal: no leg swelling  Neurological: no slurred speech ROS otherwise negative  Allergies  Oxycodone  Home Medications   Prior to Admission medications   Medication Sig Start Date End Date Taking? Authorizing Provider  ibuprofen (ADVIL,MOTRIN) 600 MG tablet Take 600 mg by mouth every 6 (six) hours as needed for mild pain.    Yes Historical Provider, MD   BP 128/89 mmHg  Pulse 80  Temp(Src) 98.6 F (37 C) (Oral)  Resp 20  SpO2 99% Physical Exam  Constitutional: He appears well-developed and well-nourished. No distress.  HENT:  Head: Normocephalic and atraumatic.  Eyes: Pupils are equal, round, and reactive to light.  Neck: Normal range of motion. Neck supple.  Cardiovascular: Normal rate and regular rhythm.   Pulmonary/Chest: Effort normal.  Abdominal: Soft.  Neurological: He is alert.  Skin: Skin is  warm and dry.  Nursing note and vitals reviewed.   ED Course  Procedures (including critical care time) Labs Review Labs Reviewed  BASIC METABOLIC PANEL - Abnormal; Notable for the following:    Glucose, Bld 142 (*)    BUN <5 (*)    All other components within normal limits  CBG MONITORING, ED - Abnormal; Notable for the following:    Glucose-Capillary 130 (*)    All other components within normal limits  CBG MONITORING, ED - Abnormal; Notable for the following:    Glucose-Capillary 123 (*)    All other components within normal limits  URINALYSIS, ROUTINE W REFLEX MICROSCOPIC (NOT AT [facility])  CBC    Imaging Review No results found. I have  personally reviewed and evaluated these images and lab results as part of my medical decision-making.   EKG Interpretation None      MDM   Final diagnoses:  Urinary frequency  Prediabetes    Glucose between 120 and 140 in the ED. He may have increased thirst to compensate for elevated glucose. His labs are otherwise normal and unremarkable. We discussed in length his diet, and how he is still prediabetic and his body may be giving him warnings that he needs to make some serious lifestyle changes, weight loss and exercise. i wiill refer to a PCP. At this time no intervention or further work-up is necessary. He is feeling well and appears well otherwise.  Medications - No data to display  34 y.o.Adam Holland's medical screening exam was performed and I feel the patient has had an appropriate workup for their chief complaint at this time and likelihood of emergent condition existing is low. They have been counseled on decision, discharge, follow up and which symptoms necessitate immediate return to the emergency department. They or their family verbally stated understanding and agreement with plan and discharged in stable condition.   Vital signs are stable at discharge. Filed Vitals:   02/15/15 2030  BP: 128/89  Pulse: 80  Temp:   Resp:        Delos Haring, PA-C 02/15/15 2044  Leo Grosser, MD 02/17/15 [phone]

## 2018-11-07 ENCOUNTER — Emergency Department (HOSPITAL_COMMUNITY)
Admission: EM | Admit: 2018-11-07 | Discharge: 2018-11-07 | Disposition: A | Discharge status: Home / Self Care | Payer: 59 | Attending: Emergency Medicine | Admitting: Emergency Medicine

## 2018-11-07 ENCOUNTER — Other Ambulatory Visit: Payer: Self-pay

## 2018-11-07 ENCOUNTER — Emergency Department (HOSPITAL_COMMUNITY): Payer: 59

## 2018-11-07 DIAGNOSIS — I10 Essential (primary) hypertension: Secondary | ICD-10-CM | POA: Insufficient documentation

## 2018-11-07 DIAGNOSIS — I739 Peripheral vascular disease, unspecified: Secondary | ICD-10-CM | POA: Diagnosis not present

## 2018-11-07 DIAGNOSIS — Y939 Activity, unspecified: Secondary | ICD-10-CM | POA: Insufficient documentation

## 2018-11-07 DIAGNOSIS — X58XXXA Exposure to other specified factors, initial encounter: Secondary | ICD-10-CM | POA: Diagnosis not present

## 2018-11-07 DIAGNOSIS — L089 Local infection of the skin and subcutaneous tissue, unspecified: Secondary | ICD-10-CM | POA: Diagnosis not present

## 2018-11-07 DIAGNOSIS — L97529 Non-pressure chronic ulcer of other part of left foot with unspecified severity: Secondary | ICD-10-CM | POA: Diagnosis not present

## 2018-11-07 DIAGNOSIS — Y999 Unspecified external cause status: Secondary | ICD-10-CM | POA: Insufficient documentation

## 2018-11-07 DIAGNOSIS — S90822A Blister (nonthermal), left foot, initial encounter: Secondary | ICD-10-CM | POA: Insufficient documentation

## 2018-11-07 DIAGNOSIS — Z23 Encounter for immunization: Secondary | ICD-10-CM | POA: Diagnosis not present

## 2018-11-07 DIAGNOSIS — Z87891 Personal history of nicotine dependence: Secondary | ICD-10-CM | POA: Insufficient documentation

## 2018-11-07 DIAGNOSIS — M79672 Pain in left foot: Secondary | ICD-10-CM | POA: Diagnosis present

## 2018-11-07 DIAGNOSIS — Y929 Unspecified place or not applicable: Secondary | ICD-10-CM | POA: Diagnosis not present

## 2018-11-07 MED ORDER — SULFAMETHOXAZOLE-TRIMETHOPRIM 800-160 MG PO TABS: 1.0000 | ORAL_TABLET | Freq: Two times a day (BID) | ORAL | 0 refills | Status: AC

## 2018-11-07 MED ORDER — IBUPROFEN 800 MG PO TABS: 800.0000 mg | ORAL_TABLET | Freq: Once | ORAL | Status: DC

## 2018-11-07 MED ORDER — TETANUS-DIPHTH-ACELL PERTUSSIS 5-2.5-18.5 LF-MCG/0.5 IM SUSP
0.5000 mL | Freq: Once | INTRAMUSCULAR | Status: AC
  Administered 2018-11-07: 20:00:00 0.5 mL via INTRAMUSCULAR
  Filled 2018-11-07: qty 0.5

## 2018-11-07 MED ORDER — CEPHALEXIN 500 MG PO CAPS: 500.0000 mg | ORAL_CAPSULE | Freq: Four times a day (QID) | ORAL | 0 refills | Status: AC

## 2018-11-07 NOTE — ED Notes (Signed)
Patient transported to X-ray 

## 2018-11-07 NOTE — ED Notes (Signed)
Patient verbalizes understanding of discharge instructions. Opportunity for questioning and answers were provided. Armband removed by staff, pt discharged from ED.  

## 2018-11-07 NOTE — ED Triage Notes (Signed)
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 Pt reports the ulcer is weeping for approx 1.5 weeks. Pt has hx of poor circulation.

## 2018-11-07 NOTE — ED Provider Notes (Signed)
[facility] EMERGENCY DEPARTMENT Provider Note   CSN: [id number] Arrival date & time: 11/07/18  1733     History   Chief Complaint Chief Complaint  Patient presents with  . pressure ulcer on left ankle    HPI Adam Holland is a 38 y.o. male.     The history is provided by the patient and medical records. No language interpreter was used.     38 year old male with significant history of varicose vein as well as peripheral vascular disease and chronic venous stasis ulcer presenting for evaluation of left foot ulceration.  Patient states he has history of recurrent pressure ulcers blisters that bothers him at least once every 2 years.  For the past week and have he noticed an area of soreness to his left medial foot.  Described as a sharp stabbing sensation, worse with prolonged standing.  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  He tries using zinc oxide as well as wearing compressive hose and for protection without adequate relief.  He took ibuprofen for pain with some improvement.  He does not complain of any fever.  No history of diabetes.  Denies numbness weakness.  Denies any recent injury.  Last Tdap was 8 years ago.  Past Medical History:  Diagnosis Date  . Acid reflux   . Hypertension   . Irregular heart beat   . Ulcer ([place]) Oct. 20, 2014   Right foot and Left foot  . Varicose veins   . Venous stasis     Patient Active Problem List   Diagnosis Date Noted  . Varicose veins of lower extremities with ulcer ([place]) 03/24/2013  . PVD (peripheral vascular disease) ([place]) 02/03/2013  . Venous stasis ulcer ([place]) 02/03/2013  . Varicose veins of lower extremities with other complications 68/03/7516  . Atherosclerosis of native arteries of the extremities with ulceration(440.23) 01/25/2013  . Pain in limb-Right foot> Left 01/25/2013    Past Surgical History:   Procedure Laterality Date  . CHOLECYSTECTOMY  Feb. 2014   Gall Bladder  . ENDOVENOUS ABLATION SAPHENOUS VEIN W/ LASER Right 03-24-2013   right small saphenous vein by Curt Jews MD  . ENDOVENOUS ABLATION SAPHENOUS VEIN W/ LASER Left 05-06-2013   left greater saphenous vein by Curt Jews MD  . [place] Medications    Prior to Admission medications   Medication Sig Start Date End Date Taking? Authorizing Provider  ibuprofen (ADVIL,MOTRIN) 600 MG tablet Take 600 mg by mouth every 6 (six) hours as needed for mild pain.     [provider]    Family History Family History  Problem Relation Age of Onset  . Deep vein thrombosis Father        Varicose Veins  . Heart attack Father     Social History Social History   Tobacco Use  . Smoking status: Former Smoker    Types: Cigarettes    Quit date: 04/08/1998    Years since quitting: 20.5  . Smokeless tobacco: Never Used  Substance Use Topics  . Alcohol use: Yes    Comment: occasional  . Drug use: No     Allergies   Oxycodone   Review of Systems Review of Systems  Constitutional: Negative for fever.  Skin: Positive for wound.  Neurological: Negative for numbness.  Physical Exam Updated Vital Signs BP (!) 146/95 (BP Location: Right Arm)   Pulse 79   Temp 98.3 F (36.8 C) (Oral)   Resp 15   SpO2 99%   Physical Exam Vitals signs and nursing note reviewed.  Constitutional:      General: He is not in acute distress.    Appearance: He is well-developed. He is obese.  HENT:     Head: Atraumatic.  Eyes:     Conjunctiva/sclera: Conjunctivae normal.  Neck:     Musculoskeletal: Neck supple.  Skin:    Findings: No rash.     Comments: Left foot: In the medial midfoot there is a silver dollar size blister oozing out fluid with tenderness to palpation and surrounding erythema.  Dorsalis pedis pulse palpable with brisk cap refill.  Neurological:     Mental Status: He is alert.       ED Treatments / Results  Labs (all labs ordered are listed, but only abnormal results are displayed) Labs Reviewed - No data to display  EKG None  Radiology Dg Foot Complete Left  Result Date: 11/07/2018 CLINICAL DATA:  Ulcer. EXAM: LEFT FOOT - COMPLETE 3+ VIEW COMPARISON:  None. FINDINGS: There is soft tissue swelling about the medial aspect of the ankle. There are a few calcifications that appear to be centered within the skin or on the skin surface medially. There is no radiographic evidence for osteomyelitis. There is no acute displaced fracture or dislocation. IMPRESSION: 1. No acute osseous abnormality. No radiographic evidence for osteomyelitis. 2. Soft tissue ulceration at the medial aspect of the ankle. Electronically Signed   By: Constance Holster M.D.   On: 11/07/2018 19:05    Procedures Procedures (including critical care time)  Medications Ordered in ED Medications  Tdap (BOOSTRIX) injection 0.5 mL (has no administration in time range)  ibuprofen (ADVIL) tablet 800 mg (has no administration in time range)     Initial Impression / Assessment and Plan / ED Course  I have reviewed the triage vital signs and the nursing notes.  Pertinent labs & imaging results that were available during my care of the patient were reviewed by me and considered in my medical decision making (see chart for details).        BP (!) 146/95 (BP Location: Right Arm)   Pulse 79   Temp 98.3 F (36.8 C) (Oral)   Resp 15   SpO2 99%    Final Clinical Impressions(s) / ED Diagnoses   Final diagnoses:  Infected blister of left foot, initial encounter    ED Discharge Orders    None     6:23 PM Patient here with pain to his left foot.  He has a ruptured blister noted to the medial aspect of his foot/ankle with surrounding skin erythema concerning for infected blister.  This area will need to be removed, and cleans.  Will update tetanus, give pain medication, and will start patient  on antibiotic.  This is not a typical spot for pressure ulcer.  7:36 PM I was able to remove the blisters and remove the excess skin.  Wound was applied with occlusive dressing and dressed.  Plan to discharge patient home with antibiotic.  Recommend return in 2 days for wound recheck.   Domenic Moras, PA-C 11/07/18 1939    Charlesetta Shanks, MD 11/08/18 1147

## 2018-11-07 NOTE — Discharge Instructions (Signed)
You have been treated for an infected blister causing cellulitis.  Take antibiotics as prescribed.  Keep wound clean, and change dressing daily.  If you notice no improvement after 2 or 3 days, return to the ED for further evaluation.

## 2018-11-08 DIAGNOSIS — S90822A Blister (nonthermal), left foot, initial encounter: Secondary | ICD-10-CM | POA: Diagnosis not present

## 2018-11-12 ENCOUNTER — Ambulatory Visit (HOSPITAL_COMMUNITY)
Admission: EM | Admit: 2018-11-12 | Discharge: 2018-11-12 | Disposition: A | Discharge status: Home / Self Care | Payer: 59 | Attending: Emergency Medicine | Admitting: Emergency Medicine

## 2018-11-12 ENCOUNTER — Other Ambulatory Visit: Payer: Self-pay

## 2018-11-12 ENCOUNTER — Encounter (HOSPITAL_COMMUNITY): Payer: Self-pay

## 2018-11-12 DIAGNOSIS — L97322 Non-pressure chronic ulcer of left ankle with fat layer exposed: Secondary | ICD-10-CM | POA: Diagnosis not present

## 2018-11-12 MED ORDER — TRAMADOL HCL 50 MG PO TABS: 50.0000 mg | ORAL_TABLET | Freq: Four times a day (QID) | ORAL | 0 refills | Status: DC | PRN

## 2018-11-12 MED ORDER — TRAMADOL HCL 50 MG PO TABS: 50.0000 mg | ORAL_TABLET | Freq: Four times a day (QID) | ORAL | 0 refills | Status: AC | PRN

## 2018-11-12 NOTE — ED Provider Notes (Signed)
[facility]    CSN: [id number] Arrival date & time: 11/12/18  1409     History   Chief Complaint Chief Complaint  Patient presents with  . Ankle Pain    HPI MAKIH STEFANKO is a 38 y.o. male.   HPI CATALDO COSGRIFF is a 38 y.o. male presenting to UC with c/o continued Left ankle pain due to chronic ulcer. Pt reports hx of recurrent skin ulcers due to venous stasis. He has had several procedures performed by a vein specialist several years ago.  For most recent ulcer, he was seen in the emergency department and started on keflex and Bactrim. He has been taking as prescribed. 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 Despite having his wound wrapped and wearing his compression stockings, he has had pain. His supervisor encouraged he f/u and stay out of work a few days due to the pain. He cannot f/u with his PCP until 11/24/2018.    Past Medical History:  Diagnosis Date  . Acid reflux   . Hypertension   . Irregular heart beat   . Ulcer Oct. 20, 2014   Right foot and Left foot  . Varicose veins   . Venous stasis     Patient Active Problem List   Diagnosis Date Noted  . Varicose veins of lower extremities with ulcer ([place]) 03/24/2013  . PVD (peripheral vascular disease) ([place]) 02/03/2013  . Venous stasis ulcer ([place]) 02/03/2013  . Varicose veins of lower extremities with other complications 61/44/3154  . Atherosclerosis of native arteries of the extremities with ulceration(440.23) 01/25/2013  . Pain in limb-Right foot> Left 01/25/2013    Past Surgical History:  Procedure Laterality Date  . CHOLECYSTECTOMY  Feb. 2014   Gall Bladder  . ENDOVENOUS ABLATION SAPHENOUS VEIN W/ LASER Right 03-24-2013   right small saphenous vein by Curt Jews MD  . ENDOVENOUS ABLATION SAPHENOUS VEIN W/ LASER Left 05-06-2013   left greater saphenous vein by Curt Jews MD  . [place] Medications    Prior to  Admission medications   Medication Sig Start Date End Date Taking? Authorizing Provider  cephALEXin (KEFLEX) 500 MG capsule Take 1 capsule (500 mg total) by mouth 4 (four) times daily. 11/07/18   Domenic Moras, PA-C  ibuprofen (ADVIL,MOTRIN) 600 MG tablet Take 600 mg by mouth every 6 (six) hours as needed for mild pain.     [provider]  sulfamethoxazole-trimethoprim (BACTRIM DS) 800-160 MG tablet Take 1 tablet by mouth 2 (two) times daily for 7 days. 11/07/18 11/14/18  Domenic Moras, PA-C  traMADol (ULTRAM) 50 MG tablet Take 1 tablet (50 mg total) by mouth every 6 (six) hours as needed. 11/12/18   Noe Gens, PA-C    Family History Family History  Problem Relation Age of Onset  . Deep vein thrombosis Father        Varicose Veins  . Heart attack Father     Social History Social History   Tobacco Use  . Smoking status: Former Smoker    Types: Cigarettes    Quit date: 04/08/1998    Years since quitting: 20.6  . Smokeless tobacco: Never Used  Substance Use Topics  . Alcohol use: Yes    Comment: occasional  . Drug use: No     Allergies   Oxycodone   Review of Systems Review of Systems  Constitutional: Negative for chills and fever.  Musculoskeletal: Negative for arthralgias and joint swelling.  Skin: Positive for wound. Negative for color change.  Neurological: Negative for weakness and numbness.     Physical Exam Triage Vital Signs ED Triage Vitals  Enc Vitals Group     BP 11/12/18 1437 137/74     Pulse Rate 11/12/18 1437 89     Resp 11/12/18 1437 18     Temp 11/12/18 1437 98.2 F (36.8 C)     Temp Source 11/12/18 1437 Oral     SpO2 11/12/18 1437 100 %     Weight 11/12/18 1438 300 lb (136.1 kg)     Height --      Head Circumference --      Peak Flow --      Pain Score 11/12/18 1438 7     Pain Loc --      Pain Edu? --      Excl. in [place]? --    No data found.  Updated Vital Signs BP 137/74 (BP Location: Right Arm)   Pulse 89   Temp 98.2 F (36.8 C)  (Oral)   Resp 18   Wt 300 lb (136.1 kg)   SpO2 100%   BMI 38.52 kg/m   Visual Acuity Right Eye Distance:   Left Eye Distance:   Bilateral Distance:    Right Eye Near:   Left Eye Near:    Bilateral Near:     Physical Exam Vitals signs and nursing note reviewed.  Constitutional:      Appearance: Normal appearance. He is well-developed.  HENT:     Head: Normocephalic and atraumatic.  Neck:     Musculoskeletal: Normal range of motion.  Cardiovascular:     Rate and Rhythm: Normal rate.  Pulmonary:     Effort: Pulmonary effort is normal.  Musculoskeletal: Normal range of motion.     Comments: Left ankle: no edema, full ROM. No bony tenderness.  Skin:    General: Skin is warm and dry.     Capillary Refill: Capillary refill takes less than 2 seconds.     Comments: Left medial ankle: shallow 3cm ulceration. Scant serous anginous discharge.   Neurological:     Mental Status: He is alert and oriented to person, place, and time.  Psychiatric:        Behavior: Behavior normal.      UC Treatments / Results  Labs (all labs ordered are listed, but only abnormal results are displayed) Labs Reviewed - No data to display  EKG   Radiology No results found.  Procedures Procedures (including critical care time)  Medications Ordered in UC Medications - No data to display  Initial Impression / Assessment and Plan / UC Course  I have reviewed the triage vital signs and the nursing notes.  Pertinent labs & imaging results that were available during my care of the patient were reviewed by me and considered in my medical decision making (see chart for details).    Wound cleaned in UC with warm water and mild soap. Pat dried. Wet to dry dressing applied. Resource info for wound center provided AVS provided.  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 Gently clean area with warm water and mild soap. Pat dry then reapply a new clean  bandage. You may try the layering used today in this urgent care or you may check the pharmacy to see if they have a bandage called Mepilex that has a built in pad to absorb any drainage.  Please follow up with family medicine and call the wound center to schedule an appointment for ongoing care/treatment of your ulceration.  Tramadol is strong pain medication. While taking, do not drink alcohol, drive, or perform any other activities that requires focus while taking these medications.      ED Prescriptions    Medication Sig Dispense Auth. Provider   traMADol (ULTRAM) 50 MG tablet Take 1 tablet (50 mg total) by mouth every 6 (six) hours as needed. 10 tablet Noe Gens, PA-C     Controlled Substance Prescriptions [place] Controlled Substance Registry consulted? Yes, I have consulted the [place] Controlled Substances Registry for this patient, and feel the risk/benefit ratio today is favorable for proceeding with this prescription for a controlled substance.   Noe Gens, [place] 11/12/18 1649

## 2018-11-12 NOTE — ED Triage Notes (Signed)
Pt states he has a ulcer on his left ankle. Pt states he tried to go back to work today and the pain worst and he has some swelling.

## 2018-11-12 NOTE — Discharge Instructions (Signed)
°  You should change your bandage at least once daily. Gently clean area with warm water and mild soap. Pat dry then reapply a new clean bandage. You may try the layering used today in this urgent care or you may check the pharmacy to see if they have a bandage called Mepilex that has a built in pad to absorb any drainage.  Please follow up with family medicine and call the wound center to schedule an appointment for ongoing care/treatment of your ulceration.  Tramadol is strong pain medication. While taking, do not drink alcohol, drive, or perform any other activities that requires focus while taking these medications.

## 2018-11-24 DIAGNOSIS — R030 Elevated blood-pressure reading, without diagnosis of hypertension: Secondary | ICD-10-CM | POA: Diagnosis not present

## 2018-11-24 DIAGNOSIS — I10 Essential (primary) hypertension: Secondary | ICD-10-CM | POA: Diagnosis not present

## 2018-11-24 DIAGNOSIS — Z0000 Encounter for general adult medical examination without abnormal findings: Secondary | ICD-10-CM | POA: Diagnosis not present

## 2018-11-24 DIAGNOSIS — E669 Obesity, unspecified: Secondary | ICD-10-CM | POA: Diagnosis not present

## 2018-11-24 DIAGNOSIS — I83009 Varicose veins of unspecified lower extremity with ulcer of unspecified site: Secondary | ICD-10-CM | POA: Diagnosis not present

## 2018-11-24 DIAGNOSIS — Z1339 Encounter for screening examination for other mental health and behavioral disorders: Secondary | ICD-10-CM | POA: Diagnosis not present

## 2018-11-24 DIAGNOSIS — R7309 Other abnormal glucose: Secondary | ICD-10-CM | POA: Diagnosis not present

## 2018-11-24 DIAGNOSIS — G629 Polyneuropathy, unspecified: Secondary | ICD-10-CM | POA: Diagnosis not present

## 2018-11-24 DIAGNOSIS — R600 Localized edema: Secondary | ICD-10-CM | POA: Diagnosis not present

## 2018-11-24 DIAGNOSIS — Z1331 Encounter for screening for depression: Secondary | ICD-10-CM | POA: Diagnosis not present

## 2018-11-30 DIAGNOSIS — R82998 Other abnormal findings in urine: Secondary | ICD-10-CM | POA: Diagnosis not present

## 2018-12-03 DIAGNOSIS — I83009 Varicose veins of unspecified lower extremity with ulcer of unspecified site: Secondary | ICD-10-CM | POA: Diagnosis not present

## 2018-12-03 DIAGNOSIS — B353 Tinea pedis: Secondary | ICD-10-CM | POA: Diagnosis not present

## 2018-12-03 DIAGNOSIS — R7309 Other abnormal glucose: Secondary | ICD-10-CM | POA: Diagnosis not present

## 2018-12-03 DIAGNOSIS — I87332 Chronic venous hypertension (idiopathic) with ulcer and inflammation of left lower extremity: Secondary | ICD-10-CM | POA: Diagnosis not present

## 2018-12-04 ENCOUNTER — Emergency Department (HOSPITAL_COMMUNITY): Payer: 59

## 2018-12-04 ENCOUNTER — Encounter (HOSPITAL_COMMUNITY): Payer: Self-pay | Admitting: Emergency Medicine

## 2018-12-04 ENCOUNTER — Emergency Department (HOSPITAL_COMMUNITY)
Admission: EM | Admit: 2018-12-04 | Discharge: 2018-12-04 | Disposition: A | Discharge status: Home / Self Care | Payer: 59 | Source: Home / Self Care | Attending: Emergency Medicine | Admitting: Emergency Medicine

## 2018-12-04 ENCOUNTER — Encounter (HOSPITAL_BASED_OUTPATIENT_CLINIC_OR_DEPARTMENT_OTHER): Payer: 59 | Attending: Internal Medicine

## 2018-12-04 ENCOUNTER — Other Ambulatory Visit: Payer: Self-pay

## 2018-12-04 ENCOUNTER — Emergency Department (HOSPITAL_COMMUNITY)
Admission: EM | Admit: 2018-12-04 | Discharge: 2018-12-04 | Discharge status: Against Medical Advice | Payer: 59 | Attending: Emergency Medicine | Admitting: Emergency Medicine

## 2018-12-04 DIAGNOSIS — E119 Type 2 diabetes mellitus without complications: Secondary | ICD-10-CM | POA: Insufficient documentation

## 2018-12-04 DIAGNOSIS — I10 Essential (primary) hypertension: Secondary | ICD-10-CM | POA: Insufficient documentation

## 2018-12-04 DIAGNOSIS — Z87891 Personal history of nicotine dependence: Secondary | ICD-10-CM | POA: Insufficient documentation

## 2018-12-04 DIAGNOSIS — R0602 Shortness of breath: Secondary | ICD-10-CM | POA: Diagnosis not present

## 2018-12-04 DIAGNOSIS — R0789 Other chest pain: Secondary | ICD-10-CM | POA: Insufficient documentation

## 2018-12-04 DIAGNOSIS — I2510 Atherosclerotic heart disease of native coronary artery without angina pectoris: Secondary | ICD-10-CM | POA: Insufficient documentation

## 2018-12-04 DIAGNOSIS — R079 Chest pain, unspecified: Secondary | ICD-10-CM | POA: Diagnosis not present

## 2018-12-04 DIAGNOSIS — Z209 Contact with and (suspected) exposure to unspecified communicable disease: Secondary | ICD-10-CM | POA: Diagnosis not present

## 2018-12-04 HISTORY — DX: Type 2 diabetes mellitus without complications: E11.9

## 2018-12-04 LAB — TROPONIN I (HIGH SENSITIVITY)
Troponin I (High Sensitivity): <2 ng/L (ref <18)
Troponin I (High Sensitivity): 3 ng/L (ref <18)

## 2018-12-04 LAB — CBC
HCT: 46.1 % (ref 39.0–52.0)
Hemoglobin: 14.9 g/dL (ref 13.0–17.0)
MCH: 27.2 pg (ref 26.0–34.0)
MCHC: 32.3 g/dL (ref 30.0–36.0)
MCV: 84.3 fL (ref 80.0–100.0)
Platelets: 262 10*3/uL (ref 150–400)
RBC: 5.47 MIL/uL (ref 4.22–5.81)
RDW: 14.3 % (ref 11.5–15.5)
WBC: 11.2 10*3/uL — ABNORMAL HIGH (ref 4.0–10.5)
nRBC: 0 % (ref 0.0–0.2)

## 2018-12-04 LAB — BASIC METABOLIC PANEL
Anion gap: 11 (ref 5–15)
BUN: 19 mg/dL (ref 6–20)
CO2: 25 mmol/L (ref 22–32)
Calcium: 10.1 mg/dL (ref 8.9–10.3)
Chloride: 100 mmol/L (ref 98–111)
Creatinine, Ser: 1.15 mg/dL (ref 0.61–1.24)
GFR calc Af Amer: >60 mL/min (ref >60)
GFR calc non Af Amer: >60 mL/min (ref >60)
Glucose, Bld: 140 mg/dL — ABNORMAL HIGH (ref 70–99)
Potassium: 4 mmol/L (ref 3.5–5.1)
Sodium: 136 mmol/L (ref 135–145)

## 2018-12-04 LAB — D-DIMER, QUANTITATIVE: D-Dimer, Quant: 0.52 ug/mL-FEU — ABNORMAL HIGH (ref 0.00–0.50)

## 2018-12-04 MED ORDER — SODIUM CHLORIDE 0.9 % IV BOLUS
1000.0000 mL | Freq: Once | INTRAVENOUS | Status: AC
  Administered 2018-12-04: 19:00:00 1000 mL via INTRAVENOUS

## 2018-12-04 MED ORDER — KETOROLAC TROMETHAMINE 30 MG/ML IJ SOLN
30.0000 mg | Freq: Once | INTRAMUSCULAR | Status: AC
  Administered 2018-12-04: 19:00:00 30 mg via INTRAVENOUS
  Filled 2018-12-04: qty 1

## 2018-12-04 MED ORDER — DIPHENHYDRAMINE HCL 50 MG/ML IJ SOLN
25.0000 mg | Freq: Once | INTRAMUSCULAR | Status: AC
  Administered 2018-12-04: 19:00:00 25 mg via INTRAVENOUS
  Filled 2018-12-04: qty 1

## 2018-12-04 MED ORDER — IOHEXOL 350 MG/ML SOLN
75.0000 mL | Freq: Once | INTRAVENOUS | Status: AC | PRN
  Administered 2018-12-04: 75 mL via INTRAVENOUS

## 2018-12-04 MED ORDER — METOCLOPRAMIDE HCL 5 MG/ML IJ SOLN
10.0000 mg | Freq: Once | INTRAMUSCULAR | Status: AC
  Administered 2018-12-04: 19:00:00 10 mg via INTRAVENOUS
  Filled 2018-12-04: qty 2

## 2018-12-04 MED ORDER — DIPHENHYDRAMINE HCL 50 MG/ML IJ SOLN
12.5000 mg | Freq: Once | INTRAMUSCULAR | Status: AC
  Administered 2018-12-04: 12.5 mg via INTRAVENOUS
  Filled 2018-12-04: qty 1

## 2018-12-04 MED ORDER — SODIUM CHLORIDE 0.9% FLUSH
3.0000 mL | Freq: Once | INTRAVENOUS | Status: AC
  Administered 2018-12-04: 19:00:00 3 mL via INTRAVENOUS

## 2018-12-04 NOTE — ED Triage Notes (Signed)
Patient in via [facility] from home c/o recurring chest pain and shortness of breath after going upstairs in his home about 30 minutes ago. Patient was just in the ED but left before being seen by a provider. Patient reports this has been an ongoing thing ever since he developed an ulcer to LLE and his PCP is concerned about CHF. Patient currently denies chest pain and states he feels fine now, but the CP and shortness of breath became concerning after climbing the stairs. Resp e/u, skin w/d.  EMS VS: 130/70, HR 92, RR 20, 97% RA, temp 97.25F temporal.

## 2018-12-04 NOTE — ED Notes (Signed)
Called for patient no answer x3.

## 2018-12-04 NOTE — ED Provider Notes (Signed)
[facility] EMERGENCY DEPARTMENT Provider Note   CSN: [id number] Arrival date & time: 12/04/18  1511     History   Chief Complaint Chief Complaint  Patient presents with  . Chest Pain  . Shortness of Breath    HPI [name] is a 38 y.o. male.     Pt presents to the ED today with sob and headache.  He works as a Mudlogger at [employer]  He had sob and cp today as well as a headache.  He was started on metformin 500 mg bid yesterday by his doctor.  He was also started on 2 BP meds.  He does not have a glucose monitor.  He has a chronic ulcer to his left leg which is bandaged.  He had a negative covid test about 1 week ago.     Past Medical History:  Diagnosis Date  . Acid reflux   . Diabetes mellitus without complication ([place])   . Hypertension   . Irregular heart beat   . Ulcer Oct. 20, 2014   Right foot and Left foot  . Varicose veins   . Venous stasis     Patient Active Problem List   Diagnosis Date Noted  . Varicose veins of lower extremities with ulcer ([place]) 03/24/2013  . PVD (peripheral vascular disease) ([place]) 02/03/2013  . Venous stasis ulcer ([place]) 02/03/2013  . Varicose veins of lower extremities with other complications 99991111  . Atherosclerosis of native arteries of the extremities with ulceration(440.23) 01/25/2013  . Pain in limb-Right foot> Left 01/25/2013    Past Surgical History:  Procedure Laterality Date  . CHOLECYSTECTOMY  Feb. 2014   Gall Bladder  . ENDOVENOUS ABLATION SAPHENOUS VEIN W/ LASER Right 03-24-2013   right small saphenous vein by Curt Jews MD  . ENDOVENOUS ABLATION SAPHENOUS VEIN W/ LASER Left 05-06-2013   left greater saphenous vein by Curt Jews MD  . [place] Medications    Prior to Admission medications   Medication Sig Start Date End Date Taking? Authorizing Provider  cephALEXin (KEFLEX) 500 MG capsule Take 1 capsule (500 mg total) by mouth 4 (four) times daily. 11/07/18    Domenic Moras, PA-C  ibuprofen (ADVIL,MOTRIN) 600 MG tablet Take 600 mg by mouth every 6 (six) hours as needed for mild pain.     [provider]  traMADol (ULTRAM) 50 MG tablet Take 1 tablet (50 mg total) by mouth every 6 (six) hours as needed. 11/12/18   Noe Gens, PA-C    Family History Family History  Problem Relation Age of Onset  . Deep vein thrombosis Father        Varicose Veins  . Heart attack Father     Social History Social History   Tobacco Use  . Smoking status: Former Smoker    Types: Cigarettes    Quit date: 04/08/1998    Years since quitting: 20.6  . Smokeless tobacco: Never Used  Substance Use Topics  . Alcohol use: Yes    Comment: occasional  . Drug use: No     Allergies   Oxycodone   Review of Systems Review of Systems  Respiratory: Positive for shortness of breath.   Neurological: Positive for headaches.  All other systems reviewed and are negative.    Physical Exam Updated Vital Signs BP 134/80   Pulse 70   Temp 98.6 F (37 C) (Oral)   Resp 15  Ht 6\' 2"  (1.88 m)   Wt 136.1 kg   SpO2 100%   BMI 38.52 kg/m   Physical Exam Vitals signs and nursing note reviewed.  Constitutional:      Appearance: He is well-developed.  HENT:     Head: Normocephalic and atraumatic.  Eyes:     Extraocular Movements: Extraocular movements intact.     Pupils: Pupils are equal, round, and reactive to light.  Neck:     Musculoskeletal: Normal range of motion and neck supple.  Cardiovascular:     Rate and Rhythm: Normal rate and regular rhythm.     Heart sounds: Normal heart sounds.  Pulmonary:     Effort: Pulmonary effort is normal.     Breath sounds: Normal breath sounds.  Abdominal:     General: Bowel sounds are normal.     Palpations: Abdomen is soft.  Musculoskeletal: Normal range of motion.     Comments: Left leg in compression dsg  Skin:    General: Skin is warm.     Capillary Refill: Capillary refill takes less than 2 seconds.   Neurological:     General: No focal deficit present.     Mental Status: He is alert and oriented to person, place, and time.  Psychiatric:        Mood and Affect: Mood normal.        Behavior: Behavior normal.      ED Treatments / Results  Labs (all labs ordered are listed, but only abnormal results are displayed) Labs Reviewed  BASIC METABOLIC PANEL - Abnormal; Notable for the following components:      Result Value   Glucose, Bld 140 (*)    All other components within normal limits  CBC - Abnormal; Notable for the following components:   WBC 11.2 (*)    All other components within normal limits  D-DIMER, QUANTITATIVE (NOT AT [facility]) - Abnormal; Notable for the following components:   D-Dimer, Quant 0.52 (*)    All other components within normal limits  TROPONIN I (HIGH SENSITIVITY)  TROPONIN I (HIGH SENSITIVITY)    EKG EKG Interpretation  Date/Time:  Friday December 04 2018 15:28:31 EDT Ventricular Rate:  100 PR Interval:  146 QRS Duration: 90 QT Interval:  336 QTC Calculation: 433 R Axis:   38 Text Interpretation:  Normal sinus rhythm Nonspecific T wave abnormality Abnormal ECG No significant change since last tracing Confirmed by Isla Pence [phone]) on 12/04/2018 6:22:57 PM   Radiology Dg Chest 2 View  Result Date: 12/04/2018 CLINICAL DATA:  Shortness of breath EXAM: CHEST - 2 VIEW COMPARISON:  None. FINDINGS: The heart size and mediastinal contours are within normal limits. Both lungs are clear. The visualized skeletal structures are unremarkable. IMPRESSION: No active cardiopulmonary disease. Electronically Signed   By: Davina Poke M.D.   On: 12/04/2018 12:53   Ct Angio Chest Pe W And/or Wo Contrast  Result Date: 12/04/2018 CLINICAL DATA:  Shortness of breath. EXAM: CT ANGIOGRAPHY CHEST WITH CONTRAST TECHNIQUE: Multidetector CT imaging of the chest was performed using the standard protocol during bolus administration of intravenous contrast. Multiplanar CT  image reconstructions and MIPs were obtained to evaluate the vascular anatomy. CONTRAST:  37mL OMNIPAQUE IOHEXOL 350 MG/ML SOLN COMPARISON:  Chest x-ray from same day. CTA chest report dated May 20, 2012. FINDINGS: Cardiovascular: Suboptimal opacification of the pulmonary arteries to the segmental level. Mixing artifact throughout the pulmonary arteries without definite evidence of pulmonary embolism. Normal heart size. No pericardial effusion. Mediastinum/Nodes:  No enlarged mediastinal, hilar, or axillary lymph nodes. Thyroid gland, trachea, and esophagus demonstrate no significant findings. Lungs/Pleura: Lungs are clear. No pleural effusion or pneumothorax. Upper Abdomen: No acute abnormality. Musculoskeletal: Bilateral gynecomastia. No acute or significant osseous findings. Review of the MIP images confirms the above findings. IMPRESSION: 1. Suboptimal study. Mixing artifact throughout the pulmonary arteries without definite evidence of pulmonary embolism. Electronically Signed   By: Titus Dubin M.D.   On: 12/04/2018 20:03    Procedures Procedures (including critical care time)  Medications Ordered in ED Medications  sodium chloride flush (NS) 0.9 % injection 3 mL (3 mLs Intravenous Given 12/04/18 1834)  sodium chloride 0.9 % bolus 1,000 mL (0 mLs Intravenous Stopped 12/04/18 1927)  metoCLOPramide (REGLAN) injection 10 mg (10 mg Intravenous Given 12/04/18 1835)  ketorolac (TORADOL) 30 MG/ML injection 30 mg (30 mg Intravenous Given 12/04/18 1835)  diphenhydrAMINE (BENADRYL) injection 12.5 mg (12.5 mg Intravenous Given 12/04/18 1835)  diphenhydrAMINE (BENADRYL) injection 25 mg (25 mg Intravenous Given 12/04/18 1858)  iohexol (OMNIPAQUE) 350 MG/ML injection 75 mL (75 mLs Intravenous Contrast Given 12/04/18 1940)     Initial Impression / Assessment and Plan / ED Course  I have reviewed the triage vital signs and the nursing notes.  Pertinent labs & imaging results that were available during my  care of the patient were reviewed by me and considered in my medical decision making (see chart for details).       Work up today is unremarkable.  He is feeling much better.  He did have some agitation after reglan which resolved with benadryl.  Sx are likely due to new meds.  He is instructed to return if worse.  F/u with pcp.  Final Clinical Impressions(s) / ED Diagnoses   Final diagnoses:  Atypical chest pain    ED Discharge Orders    None       Isla Pence, MD 12/04/18 2012

## 2018-12-04 NOTE — ED Notes (Signed)
Called patient x 1 no answer

## 2018-12-04 NOTE — ED Notes (Signed)
Called for patient x2 no answer

## 2018-12-04 NOTE — ED Triage Notes (Signed)
Pt reports SOB since he came to to work this a.m. Pt able to speak in full sentences at a rapid rate.

## 2018-12-07 DIAGNOSIS — R0602 Shortness of breath: Secondary | ICD-10-CM | POA: Diagnosis not present

## 2018-12-07 DIAGNOSIS — I83009 Varicose veins of unspecified lower extremity with ulcer of unspecified site: Secondary | ICD-10-CM | POA: Diagnosis not present

## 2018-12-07 DIAGNOSIS — I10 Essential (primary) hypertension: Secondary | ICD-10-CM | POA: Diagnosis not present

## 2018-12-07 DIAGNOSIS — E669 Obesity, unspecified: Secondary | ICD-10-CM | POA: Diagnosis not present

## 2018-12-07 DIAGNOSIS — B353 Tinea pedis: Secondary | ICD-10-CM | POA: Diagnosis not present

## 2018-12-07 DIAGNOSIS — R7309 Other abnormal glucose: Secondary | ICD-10-CM | POA: Diagnosis not present

## 2018-12-09 DIAGNOSIS — I87332 Chronic venous hypertension (idiopathic) with ulcer and inflammation of left lower extremity: Secondary | ICD-10-CM | POA: Diagnosis not present

## 2018-12-09 DIAGNOSIS — R7309 Other abnormal glucose: Secondary | ICD-10-CM | POA: Diagnosis not present

## 2018-12-09 DIAGNOSIS — B353 Tinea pedis: Secondary | ICD-10-CM | POA: Diagnosis not present

## 2018-12-09 DIAGNOSIS — E669 Obesity, unspecified: Secondary | ICD-10-CM | POA: Diagnosis not present

## 2019-07-16 ENCOUNTER — Ambulatory Visit: Payer: Self-pay | Attending: Internal Medicine

## 2019-07-16 DIAGNOSIS — Z23 Encounter for immunization: Secondary | ICD-10-CM

## 2019-07-16 NOTE — Progress Notes (Signed)
   Covid-19 Vaccination Clinic  Name:  Shaheer Kinas    MRN: [id number] DOB: 03-31-1981  07/16/2019  Mr. Mundie was observed post Covid-19 immunization for 15 minutes without incident. He was provided with Vaccine Information Sheet and instruction to access the V-Safe system.   Mr. Sauvageau was instructed to call 911 with any severe reactions post vaccine: Marland Kitchen Difficulty breathing  . Swelling of face and throat  . A fast heartbeat  . A bad rash all over body  . Dizziness and weakness   Immunizations Administered    Name Date Dose VIS Date Route   Moderna COVID-19 Vaccine 07/16/2019  8:02 AM 0.5 mL 03/09/2019 Intramuscular   Manufacturer: Moderna   Lot: [id number]   [id number]

## 2019-07-23 ENCOUNTER — Other Ambulatory Visit: Payer: Self-pay

## 2019-07-23 ENCOUNTER — Encounter (HOSPITAL_COMMUNITY): Payer: Self-pay

## 2019-07-23 ENCOUNTER — Emergency Department (HOSPITAL_COMMUNITY): Payer: 59

## 2019-07-23 ENCOUNTER — Emergency Department (HOSPITAL_COMMUNITY)
Admission: EM | Admit: 2019-07-23 | Discharge: 2019-07-23 | Disposition: A | Discharge status: Home / Self Care | Payer: 59 | Attending: Emergency Medicine | Admitting: Emergency Medicine

## 2019-07-23 DIAGNOSIS — I10 Essential (primary) hypertension: Secondary | ICD-10-CM | POA: Insufficient documentation

## 2019-07-23 DIAGNOSIS — F329 Major depressive disorder, single episode, unspecified: Secondary | ICD-10-CM | POA: Diagnosis not present

## 2019-07-23 DIAGNOSIS — R0789 Other chest pain: Secondary | ICD-10-CM | POA: Insufficient documentation

## 2019-07-23 DIAGNOSIS — Z79899 Other long term (current) drug therapy: Secondary | ICD-10-CM | POA: Insufficient documentation

## 2019-07-23 DIAGNOSIS — Z87891 Personal history of nicotine dependence: Secondary | ICD-10-CM | POA: Diagnosis not present

## 2019-07-23 DIAGNOSIS — E119 Type 2 diabetes mellitus without complications: Secondary | ICD-10-CM | POA: Diagnosis not present

## 2019-07-23 DIAGNOSIS — R079 Chest pain, unspecified: Secondary | ICD-10-CM | POA: Diagnosis not present

## 2019-07-23 DIAGNOSIS — F32A Depression, unspecified: Secondary | ICD-10-CM

## 2019-07-23 LAB — COMPREHENSIVE METABOLIC PANEL
ALT: 24 U/L (ref 0–44)
AST: 16 U/L (ref 15–41)
Albumin: 4.7 g/dL (ref 3.5–5.0)
Alkaline Phosphatase: 51 U/L (ref 38–126)
Anion gap: 9 (ref 5–15)
BUN: 11 mg/dL (ref 6–20)
CO2: 26 mmol/L (ref 22–32)
Calcium: 9.3 mg/dL (ref 8.9–10.3)
Chloride: 104 mmol/L (ref 98–111)
Creatinine, Ser: 1.1 mg/dL (ref 0.61–1.24)
GFR calc Af Amer: >60 mL/min (ref >60)
GFR calc non Af Amer: >60 mL/min (ref >60)
Glucose, Bld: 119 mg/dL — ABNORMAL HIGH (ref 70–99)
Potassium: 3.9 mmol/L (ref 3.5–5.1)
Sodium: 139 mmol/L (ref 135–145)
Total Bilirubin: 0.6 mg/dL (ref 0.3–1.2)
Total Protein: 8.2 g/dL — ABNORMAL HIGH (ref 6.5–8.1)

## 2019-07-23 LAB — CBC WITH DIFFERENTIAL/PLATELET
Abs Immature Granulocytes: 0.03 10*3/uL (ref 0.00–0.07)
Basophils Absolute: 0.1 10*3/uL (ref 0.0–0.1)
Basophils Relative: 1 %
Eosinophils Absolute: 0.2 10*3/uL (ref 0.0–0.5)
Eosinophils Relative: 2 %
HCT: 46 % (ref 39.0–52.0)
Hemoglobin: 14.4 g/dL (ref 13.0–17.0)
Immature Granulocytes: 0 %
Lymphocytes Relative: 24 %
Lymphs Abs: 2.4 10*3/uL (ref 0.7–4.0)
MCH: 27.1 pg (ref 26.0–34.0)
MCHC: 31.3 g/dL (ref 30.0–36.0)
MCV: 86.6 fL (ref 80.0–100.0)
Monocytes Absolute: 0.6 10*3/uL (ref 0.1–1.0)
Monocytes Relative: 6 %
Neutro Abs: 6.7 10*3/uL (ref 1.7–7.7)
Neutrophils Relative %: 67 %
Platelets: 258 10*3/uL (ref 150–400)
RBC: 5.31 MIL/uL (ref 4.22–5.81)
RDW: 14.4 % (ref 11.5–15.5)
WBC: 10 10*3/uL (ref 4.0–10.5)
nRBC: 0 % (ref 0.0–0.2)

## 2019-07-23 LAB — TROPONIN I (HIGH SENSITIVITY)
Troponin I (High Sensitivity): <2 ng/L (ref <18)
Troponin I (High Sensitivity): <2 ng/L (ref <18)

## 2019-07-23 MED ORDER — KETOROLAC TROMETHAMINE 30 MG/ML IJ SOLN
30.0000 mg | Freq: Once | INTRAMUSCULAR | Status: AC
  Administered 2019-07-23: 04:00:00 30 mg via INTRAVENOUS
  Filled 2019-07-23: qty 1

## 2019-07-23 MED ORDER — NAPROXEN 500 MG PO TABS: ORAL_TABLET | ORAL | 0 refills | Status: AC

## 2019-07-23 MED ORDER — METHOCARBAMOL 500 MG PO TABS: ORAL_TABLET | ORAL | 0 refills | Status: AC

## 2019-07-23 NOTE — Discharge Instructions (Addendum)
Use ice and heat for comfort.  Take the medication as prescribed.  Please recheck if you get fever, cough, or get very short of breath.  Please talk to your primary care doctor about starting you on medication for depression or look at the resource guide to get a mental health provider.
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
## 2019-07-23 NOTE — ED Triage Notes (Signed)
Pt reports chest pain that started about an hour ago. Pt says he has felt tired and lightheaded today, (prior to coming to work), then when he got to work started having left arm pain, the pain progressed and moved to chest, then right arm and back to chest. Pt reports increased stress level. Reports chest pain as "pressure and tightness". Pain is reproducible with palpation.

## 2019-08-30 IMAGING — CR CHEST - 2 VIEW
2 series · 2 of 2 positions shown · non-contrast
Comparison: None.

CLINICAL DATA: Shortness of breath

EXAM:
CHEST - 2 VIEW

[chest pa]
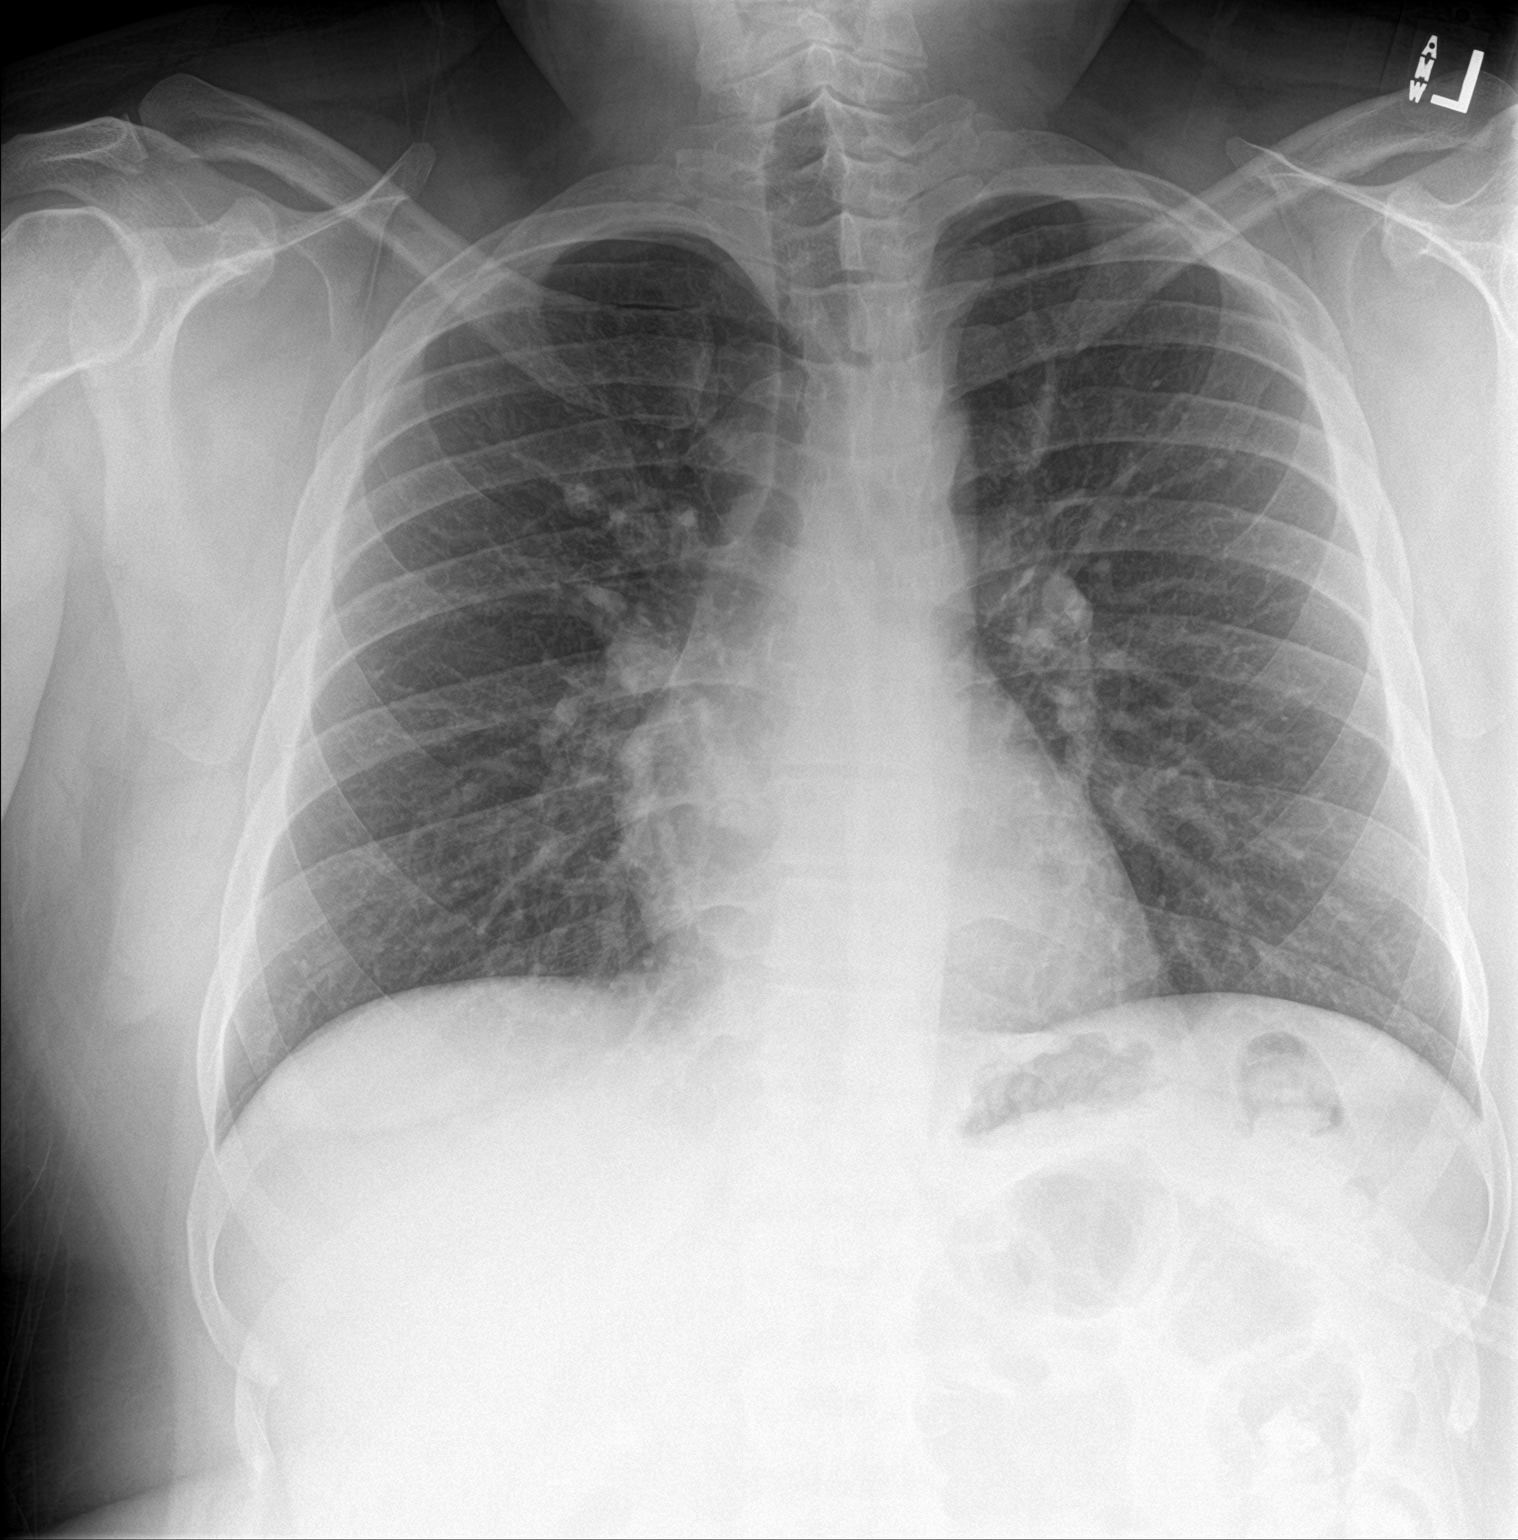

[chest lat]
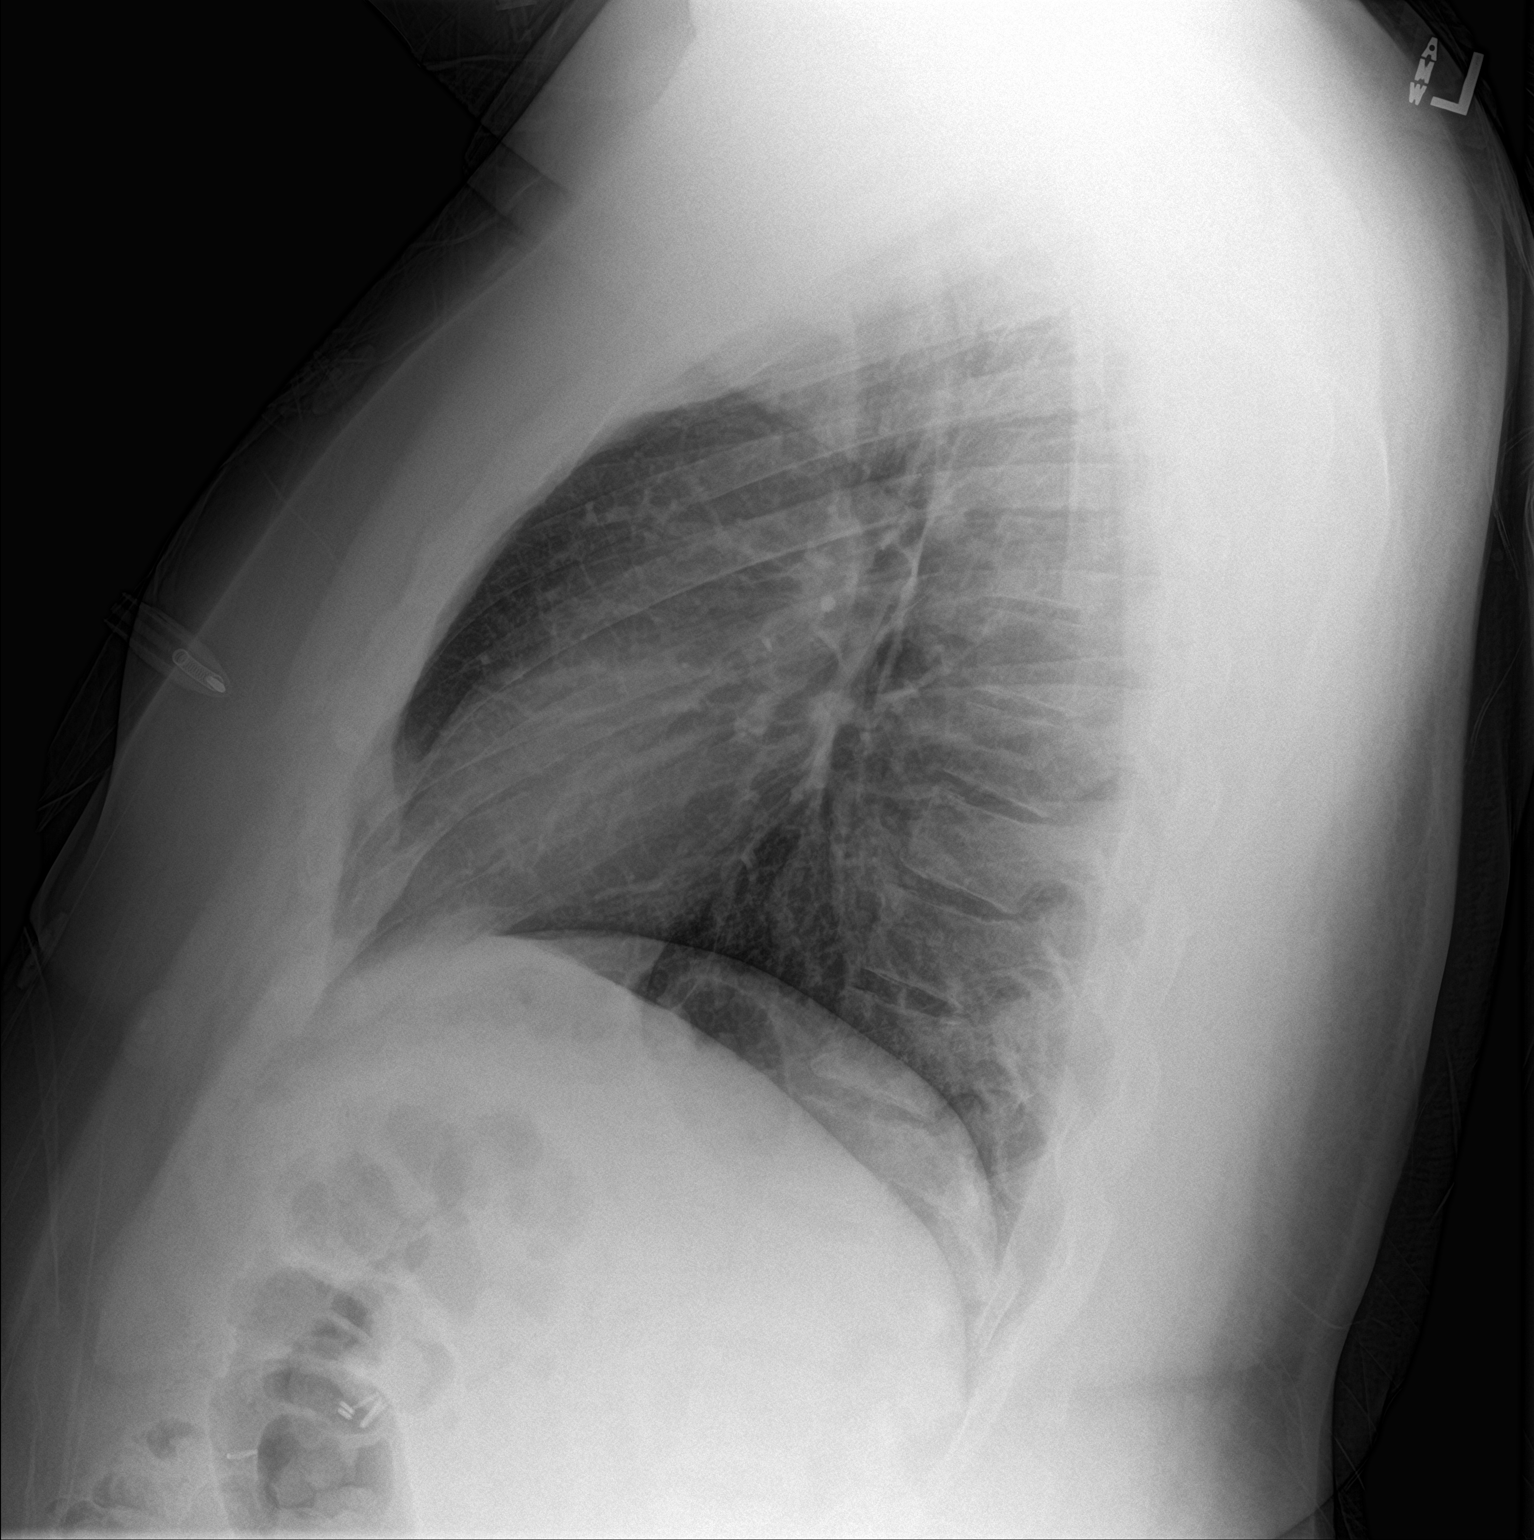

[2 of 2 positions shown; findings below may reference images not displayed]

FINDINGS: The heart size and mediastinal contours are within normal limits.
Both lungs are clear. The visualized skeletal structures are
unremarkable.
IMPRESSION: No active cardiopulmonary disease.

## 2019-12-10 ENCOUNTER — Encounter (HOSPITAL_COMMUNITY): Payer: Self-pay | Admitting: *Deleted

## 2019-12-10 ENCOUNTER — Ambulatory Visit (HOSPITAL_COMMUNITY)
Admission: EM | Admit: 2019-12-10 | Discharge: 2019-12-10 | Disposition: A | Discharge status: Home / Self Care | Payer: 59 | Attending: Family Medicine | Admitting: Family Medicine

## 2019-12-10 ENCOUNTER — Other Ambulatory Visit: Payer: Self-pay

## 2019-12-10 DIAGNOSIS — I10 Essential (primary) hypertension: Secondary | ICD-10-CM | POA: Diagnosis not present

## 2019-12-10 DIAGNOSIS — R079 Chest pain, unspecified: Secondary | ICD-10-CM | POA: Diagnosis not present

## 2019-12-10 DIAGNOSIS — K219 Gastro-esophageal reflux disease without esophagitis: Secondary | ICD-10-CM

## 2019-12-10 MED ORDER — AMLODIPINE BESYLATE 5 MG PO TABS: 5.0000 mg | ORAL_TABLET | Freq: Every day | ORAL | 0 refills | Status: AC

## 2019-12-10 MED ORDER — PANTOPRAZOLE SODIUM 40 MG PO TBEC: 40.0000 mg | DELAYED_RELEASE_TABLET | Freq: Every day | ORAL | 0 refills | Status: DC

## 2019-12-10 NOTE — ED Notes (Signed)
Patient states while sitting being triaged he had mild discomfort in chest with no associated symptoms. Will do EKG per protocol.

## 2019-12-10 NOTE — ED Triage Notes (Addendum)
Patient states that last year he was diagnosed with hypertension and was put on a HTN medication. Patient states he did not continue taking it.   States that recently blood pressure was been in the 48G diastolic and has had intermittent headaches.   Was placed on Lisinopril and HCTZ 12.5mg . States that the lisinopril did cause dizziness when he took it. Is in school currently and took a dose of HCTZ yesterday.

## 2019-12-10 NOTE — ED Provider Notes (Signed)
Adam Holland    CSN: [id number] Arrival date & time: 12/10/19  1021      History   Chief Complaint Chief Complaint  Patient presents with  . Hypertension    HPI Adam Holland is a 39 y.o. male.   Here today concerned about persistently elevated blood pressure for several months now. Was tried on lisinopril HCTZ last year through PCP, lisinopril caused dizziness and HCTZ caused urinary frequency. Going to dentist and chiropractor frequently and BPs there always a bit elevated (130s-140s/90s range). Having intermittent posterior headaches and substernal CP that are mild which are causing him to be concerned and want to get back on something for his BPs immediately. Denies SOB, diaphoresis, syncope, dizziness, arm pain, N/V. Has been worked up for CP several times the past year without obvious cardiac cause. He states he feels his CP is more related to indigestion based on where the pain is at.      Past Medical History:  Diagnosis Date  . Acid reflux   . Diabetes mellitus without complication ([place])   . Hypertension   . Irregular heart beat   . Ulcer Oct. 20, 2014   Right foot and Left foot  . Varicose veins   . Venous stasis     Patient Active Problem List   Diagnosis Date Noted  . Varicose veins of lower extremities with ulcer ([place]) 03/24/2013  . PVD (peripheral vascular disease) ([place]) 02/03/2013  . Venous stasis ulcer ([place]) 02/03/2013  . Varicose veins of lower extremities with other complications 46/50/3546  . Atherosclerosis of native arteries of the extremities with ulceration(440.23) 01/25/2013  . Pain in limb-Right foot> Left 01/25/2013    Past Surgical History:  Procedure Laterality Date  . CHOLECYSTECTOMY  Feb. 2014   Gall Bladder  . ENDOVENOUS ABLATION SAPHENOUS VEIN W/ LASER Right 03-24-2013   right small saphenous vein by Curt Jews MD  . ENDOVENOUS ABLATION SAPHENOUS VEIN W/ LASER Left 05-06-2013   left greater saphenous vein by Curt Jews  MD  . [place] Medications    Prior to Admission medications   Medication Sig Start Date End Date Taking? Authorizing Provider  amLODipine (NORVASC) 5 MG tablet Take 1 tablet (5 mg total) by mouth daily. 12/10/19   Volney American, PA-C  cephALEXin (KEFLEX) 500 MG capsule Take 1 capsule (500 mg total) by mouth 4 (four) times daily. 11/07/18   Domenic Moras, PA-C  ibuprofen (ADVIL,MOTRIN) 600 MG tablet Take 600 mg by mouth every 6 (six) hours as needed for mild pain.     [provider]  methocarbamol (ROBAXIN) 500 MG tablet Take 1 or 2 po Q 6hrs for muscle pain 07/23/19   Rolland Porter, MD  naproxen (NAPROSYN) 500 MG tablet Take 1 po BID with food prn pain 07/23/19   Rolland Porter, MD  pantoprazole (PROTONIX) 40 MG tablet Take 1 tablet (40 mg total) by mouth daily. 12/10/19   Volney American, PA-C  traMADol (ULTRAM) 50 MG tablet Take 1 tablet (50 mg total) by mouth every 6 (six) hours as needed. 11/12/18   Noe Gens, PA-C    Family History Family History  Problem Relation Age of Onset  . Deep vein thrombosis Father        Varicose Veins  . Heart attack Father     Social History Social History   Tobacco Use  . Smoking status: Former Smoker    Types:  Cigarettes    Quit date: 04/08/1998    Years since quitting: 21.6  . Smokeless tobacco: Never Used  Substance Use Topics  . Alcohol use: Yes    Comment: occasional  . Drug use: No     Allergies   Oxycodone   Review of Systems Review of Systems  Constitutional: Negative.   HENT: Negative.   Eyes: Negative.   Respiratory: Negative.   Cardiovascular: Positive for chest pain.  Gastrointestinal: Negative.   Genitourinary: Negative.   Musculoskeletal: Negative.   Skin: Negative.   Neurological: Positive for headaches.  Psychiatric/Behavioral: Negative.      Physical Exam Triage Vital Signs ED Triage Vitals  Enc Vitals Group     BP 12/10/19 1132 (!) 129/95     Pulse Rate  12/10/19 1132 67     Resp 12/10/19 1132 16     Temp 12/10/19 1132 98 F (36.7 C)     Temp src --      SpO2 12/10/19 1132 100 %     Weight --      Height --      Head Circumference --      Peak Flow --      Pain Score 12/10/19 1130 0     Pain Loc --      Pain Edu? --      Excl. in [place]? --    No data found.  Updated Vital Signs BP (!) 129/95 (BP Location: Left Arm)   Pulse 67   Temp 98 F (36.7 C)   Resp 16   SpO2 100%   Visual Acuity Right Eye Distance:   Left Eye Distance:   Bilateral Distance:    Right Eye Near:   Left Eye Near:    Bilateral Near:     Physical Exam Vitals and nursing note reviewed.  Constitutional:      Appearance: Normal appearance.  HENT:     Head: Atraumatic.     Mouth/Throat:     Mouth: Mucous membranes are moist.     Pharynx: Oropharynx is clear.  Eyes:     Extraocular Movements: Extraocular movements intact.     Conjunctiva/sclera: Conjunctivae normal.  Cardiovascular:     Rate and Rhythm: Normal rate and regular rhythm.  Pulmonary:     Effort: Pulmonary effort is normal.     Breath sounds: Normal breath sounds.  Abdominal:     General: Bowel sounds are normal. There is no distension.     Palpations: Abdomen is soft.     Tenderness: There is no abdominal tenderness.  Musculoskeletal:        General: Normal range of motion.     Cervical back: Normal range of motion and neck supple.  Skin:    General: Skin is warm and dry.  Neurological:     General: No focal deficit present.     Mental Status: He is oriented to person, place, and time.  Psychiatric:        Mood and Affect: Mood normal.        Thought Content: Thought content normal.        Judgment: Judgment normal.      UC Treatments / Results  Labs (all labs ordered are listed, but only abnormal results are displayed) Labs Reviewed - No data to display  EKG   Radiology No results found.  Procedures Procedures (including critical care time)  Medications  Ordered in UC Medications - No data to display  Initial Impression / Assessment  and Plan / UC Course  I have reviewed the triage vital signs and the nursing notes.  Pertinent labs & imaging results that were available during my care of the patient were reviewed by me and considered in my medical decision making (see chart for details).     HTN - start amlodipine 5 mg, continue monitoring home BPs and record readings. Follow up as soon as possible with PCP for recheck and further instruction. DASH diet, exercise, stress control reviewed.   CP - EKG today NSR, normal rate and no evident ST or T wave abnormalities. Reassurance given, trial protonix and diet changes to see if this improves and f/u with PCP for recheck.   Final Clinical Impressions(s) / UC Diagnoses   Final diagnoses:  Essential hypertension  Chest pain, unspecified type  Gastroesophageal reflux disease without esophagitis   Discharge Instructions   None    ED Prescriptions    Medication Sig Dispense Auth. Provider   amLODipine (NORVASC) 5 MG tablet Take 1 tablet (5 mg total) by mouth daily. 30 tablet Volney American, [place]   pantoprazole (PROTONIX) 40 MG tablet Take 1 tablet (40 mg total) by mouth daily. 30 tablet Volney American, [place]     PDMP not reviewed this encounter.   Volney American, [place] 12/10/19 1220

## 2020-01-07 ENCOUNTER — Other Ambulatory Visit: Payer: Self-pay | Admitting: Family Medicine

## 2020-02-23 DIAGNOSIS — D225 Melanocytic nevi of trunk: Secondary | ICD-10-CM | POA: Diagnosis not present

## 2020-02-23 DIAGNOSIS — D485 Neoplasm of uncertain behavior of skin: Secondary | ICD-10-CM | POA: Diagnosis not present

## 2020-04-17 IMAGING — DX DG CHEST 1V PORT
1 series · 1 of 1 positions shown · non-contrast
Comparison: 12/04/2018

CLINICAL DATA: Chest pain

EXAM:
PORTABLE CHEST 1 VIEW

[chest ap grid]
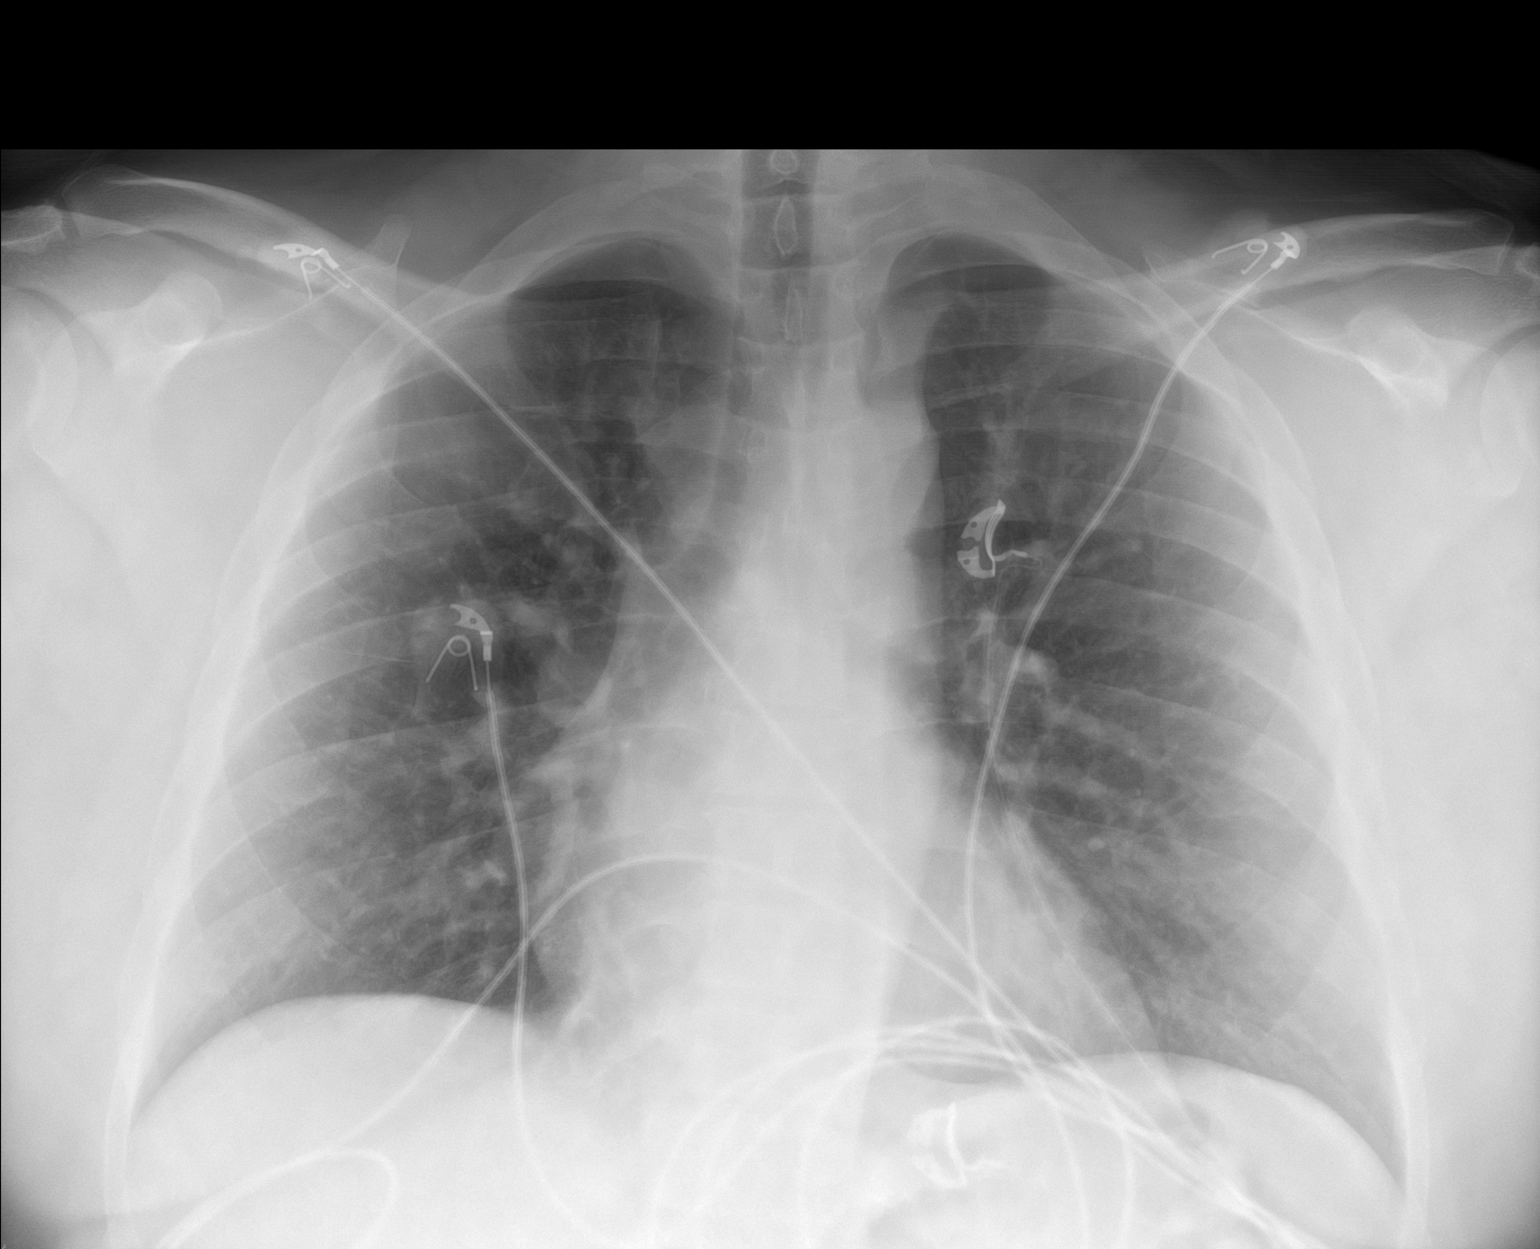

[1 of 1 positions shown; findings below may reference images not displayed]

FINDINGS: Normal heart size and mediastinal contours. No acute infiltrate or
edema. No effusion or pneumothorax. No acute osseous findings.
Artifact from EKG leads.
IMPRESSION: No active disease.

## 2020-12-28 ENCOUNTER — Emergency Department (HOSPITAL_COMMUNITY)
Admission: EM | Admit: 2020-12-28 | Discharge: 2020-12-28 | Disposition: A | Discharge status: Home / Self Care | Payer: 59 | Attending: Emergency Medicine | Admitting: Emergency Medicine

## 2020-12-28 ENCOUNTER — Other Ambulatory Visit: Payer: Self-pay

## 2020-12-28 ENCOUNTER — Encounter (HOSPITAL_COMMUNITY): Payer: Self-pay

## 2020-12-28 DIAGNOSIS — R0789 Other chest pain: Secondary | ICD-10-CM | POA: Diagnosis not present

## 2020-12-28 DIAGNOSIS — R079 Chest pain, unspecified: Secondary | ICD-10-CM | POA: Diagnosis not present

## 2020-12-28 DIAGNOSIS — R000 Tachycardia, unspecified: Secondary | ICD-10-CM | POA: Diagnosis not present

## 2020-12-28 DIAGNOSIS — Z5321 Procedure and treatment not carried out due to patient leaving prior to being seen by health care provider: Secondary | ICD-10-CM | POA: Insufficient documentation

## 2020-12-28 DIAGNOSIS — I10 Essential (primary) hypertension: Secondary | ICD-10-CM | POA: Diagnosis not present

## 2020-12-28 DIAGNOSIS — T40711A Poisoning by cannabis, accidental (unintentional), initial encounter: Secondary | ICD-10-CM | POA: Insufficient documentation

## 2020-12-28 LAB — COMPREHENSIVE METABOLIC PANEL
ALT: 17 U/L (ref 0–44)
AST: 23 U/L (ref 15–41)
Albumin: 4.2 g/dL (ref 3.5–5.0)
Alkaline Phosphatase: 48 U/L (ref 38–126)
Anion gap: 8 (ref 5–15)
BUN: 17 mg/dL (ref 6–20)
CO2: 24 mmol/L (ref 22–32)
Calcium: 8.5 mg/dL — ABNORMAL LOW (ref 8.9–10.3)
Chloride: 103 mmol/L (ref 98–111)
Creatinine, Ser: 1.24 mg/dL (ref 0.61–1.24)
GFR, Estimated: >60 mL/min (ref >60)
Glucose, Bld: 173 mg/dL — ABNORMAL HIGH (ref 70–99)
Potassium: 3.9 mmol/L (ref 3.5–5.1)
Sodium: 135 mmol/L (ref 135–145)
Total Bilirubin: 0.8 mg/dL (ref 0.3–1.2)
Total Protein: 7.9 g/dL (ref 6.5–8.1)

## 2020-12-28 LAB — CBC WITH DIFFERENTIAL/PLATELET
Abs Immature Granulocytes: 0.04 10*3/uL (ref 0.00–0.07)
Basophils Absolute: 0.1 10*3/uL (ref 0.0–0.1)
Basophils Relative: 1 %
Eosinophils Absolute: 0.1 10*3/uL (ref 0.0–0.5)
Eosinophils Relative: 1 %
HCT: 44.9 % (ref 39.0–52.0)
Hemoglobin: 14.5 g/dL (ref 13.0–17.0)
Immature Granulocytes: 0 %
Lymphocytes Relative: 14 %
Lymphs Abs: 1.6 10*3/uL (ref 0.7–4.0)
MCH: 28.7 pg (ref 26.0–34.0)
MCHC: 32.3 g/dL (ref 30.0–36.0)
MCV: 88.7 fL (ref 80.0–100.0)
Monocytes Absolute: 0.7 10*3/uL (ref 0.1–1.0)
Monocytes Relative: 6 %
Neutro Abs: 9 10*3/uL — ABNORMAL HIGH (ref 1.7–7.7)
Neutrophils Relative %: 78 %
Platelets: 209 10*3/uL (ref 150–400)
RBC: 5.06 MIL/uL (ref 4.22–5.81)
RDW: 13.9 % (ref 11.5–15.5)
WBC: 11.5 10*3/uL — ABNORMAL HIGH (ref 4.0–10.5)
nRBC: 0 % (ref 0.0–0.2)

## 2020-12-28 NOTE — ED Triage Notes (Signed)
Pt BIB EMS. 2100 pt took 2 CBD gummies. Pt complains of abnormal sensation and a delayed feeling. 18 g left ac.

## 2024-04-12 ENCOUNTER — Emergency Department (HOSPITAL_BASED_OUTPATIENT_CLINIC_OR_DEPARTMENT_OTHER)

## 2024-04-12 ENCOUNTER — Emergency Department (HOSPITAL_BASED_OUTPATIENT_CLINIC_OR_DEPARTMENT_OTHER)
Admission: EM | Admit: 2024-04-12 | Discharge: 2024-04-12 | Disposition: A | Discharge status: Home / Self Care | Attending: Emergency Medicine | Admitting: Emergency Medicine

## 2024-04-12 ENCOUNTER — Encounter (HOSPITAL_BASED_OUTPATIENT_CLINIC_OR_DEPARTMENT_OTHER): Payer: Self-pay | Admitting: Emergency Medicine

## 2024-04-12 ENCOUNTER — Other Ambulatory Visit: Payer: Self-pay

## 2024-04-12 DIAGNOSIS — R1013 Epigastric pain: Secondary | ICD-10-CM | POA: Diagnosis present

## 2024-04-12 DIAGNOSIS — R5383 Other fatigue: Secondary | ICD-10-CM | POA: Insufficient documentation

## 2024-04-12 DIAGNOSIS — R110 Nausea: Secondary | ICD-10-CM | POA: Insufficient documentation

## 2024-04-12 LAB — URINALYSIS, ROUTINE W REFLEX MICROSCOPIC
Bilirubin Urine: NEGATIVE
Glucose, UA: NEGATIVE mg/dL
Hgb urine dipstick: NEGATIVE
Ketones, ur: NEGATIVE mg/dL
Leukocytes,Ua: NEGATIVE
Nitrite: NEGATIVE
Protein, ur: NEGATIVE mg/dL
Specific Gravity, Urine: 1.015 (ref 1.005–1.030)
pH: 7 (ref 5.0–8.0)

## 2024-04-12 LAB — LIPASE, BLOOD: Lipase: 42 U/L (ref 11–51)

## 2024-04-12 LAB — COMPREHENSIVE METABOLIC PANEL WITH GFR
ALT: 31 U/L (ref 0–44)
AST: 20 U/L (ref 15–41)
Albumin: 4.2 g/dL (ref 3.5–5.0)
Alkaline Phosphatase: 58 U/L (ref 38–126)
Anion gap: 10 (ref 5–15)
BUN: 8 mg/dL (ref 6–20)
CO2: 25 mmol/L (ref 22–32)
Calcium: 9.3 mg/dL (ref 8.9–10.3)
Chloride: 104 mmol/L (ref 98–111)
Creatinine, Ser: 0.95 mg/dL (ref 0.61–1.24)
GFR, Estimated: >60 mL/min
Glucose, Bld: 105 mg/dL — ABNORMAL HIGH (ref 70–99)
Potassium: 4 mmol/L (ref 3.5–5.1)
Sodium: 139 mmol/L (ref 135–145)
Total Bilirubin: 0.4 mg/dL (ref 0.0–1.2)
Total Protein: 7.4 g/dL (ref 6.5–8.1)

## 2024-04-12 LAB — CBC
HCT: 43.1 % (ref 39.0–52.0)
Hemoglobin: 14.3 g/dL (ref 13.0–17.0)
MCH: 28 pg (ref 26.0–34.0)
MCHC: 33.2 g/dL (ref 30.0–36.0)
MCV: 84.5 fL (ref 80.0–100.0)
Platelets: 220 K/uL (ref 150–400)
RBC: 5.1 MIL/uL (ref 4.22–5.81)
RDW: 14.3 % (ref 11.5–15.5)
WBC: 8.6 K/uL (ref 4.0–10.5)
nRBC: 0 % (ref 0.0–0.2)

## 2024-04-12 LAB — RESP PANEL BY RT-PCR (RSV, FLU A&B, COVID)  RVPGX2
Influenza A by PCR: NEGATIVE
Influenza B by PCR: NEGATIVE
Resp Syncytial Virus by PCR: NEGATIVE
SARS Coronavirus 2 by RT PCR: NEGATIVE

## 2024-04-12 MED ORDER — PANTOPRAZOLE SODIUM 20 MG PO TBEC: 20.0000 mg | DELAYED_RELEASE_TABLET | Freq: Every day | ORAL | 0 refills | Status: AC

## 2024-04-12 MED ORDER — ALUM & MAG HYDROXIDE-SIMETH 200-200-20 MG/5ML PO SUSP
30.0000 mL | Freq: Once | ORAL | Status: AC
  Administered 2024-04-12: 30 mL via ORAL
  Filled 2024-04-12: qty 30

## 2024-04-12 MED ORDER — IOHEXOL 300 MG/ML  SOLN
100.0000 mL | Freq: Once | INTRAMUSCULAR | Status: AC | PRN
  Administered 2024-04-12: 100 mL via INTRAVENOUS

## 2024-04-12 MED ORDER — LACTATED RINGERS IV BOLUS
1000.0000 mL | Freq: Once | INTRAVENOUS | Status: AC
  Administered 2024-04-12: 1000 mL via INTRAVENOUS

## 2024-04-12 MED ORDER — FAMOTIDINE 40 MG PO TABS: 40.0000 mg | ORAL_TABLET | Freq: Every day | ORAL | 0 refills | Status: AC

## 2024-04-12 MED ORDER — LIDOCAINE VISCOUS HCL 2 % MT SOLN
15.0000 mL | Freq: Once | OROMUCOSAL | Status: AC
  Administered 2024-04-12: 15 mL via ORAL
  Filled 2024-04-12: qty 15

## 2024-04-12 NOTE — ED Provider Notes (Signed)
 " [facility] AT [facility] Provider Note   CSN: [id number] Arrival date & time: 04/12/24  0303     History Chief Complaint  Patient presents with   Abdominal Pain    HPI Adam Holland is a 44 y.o. male presenting for abdominal pain and nausea. Pain gets worse when he lays down Has severe fatigue.  Severe cramping in his epigastrum  Patient's recorded medical, surgical, social, medication list and allergies were reviewed in the Snapshot window as part of the initial history.   Review of Systems   Review of Systems  Constitutional:  Positive for fatigue. Negative for chills and fever.  HENT:  Negative for ear pain and sore throat.   Eyes:  Negative for pain and visual disturbance.  Respiratory:  Negative for cough and shortness of breath.   Cardiovascular:  Negative for chest pain and palpitations.  Gastrointestinal:  Positive for abdominal pain and constipation. Negative for nausea and vomiting.  Genitourinary:  Negative for dysuria and hematuria.  Musculoskeletal:  Negative for arthralgias and back pain.  Skin:  Negative for color change and rash.  Neurological:  Negative for seizures and syncope.  All other systems reviewed and are negative.   Physical Exam Updated Vital Signs BP (!) 152/100   Pulse 69   Temp 98 F (36.7 C) (Oral)   Resp 16   Wt 133.8 kg   SpO2 100%   BMI 37.88 kg/m  Physical Exam Vitals and nursing note reviewed.  Constitutional:      General: He is not in acute distress.    Appearance: He is well-developed.  HENT:     Head: Normocephalic and atraumatic.  Eyes:     Conjunctiva/sclera: Conjunctivae normal.  Cardiovascular:     Rate and Rhythm: Normal rate and regular rhythm.     Heart sounds: No murmur heard. Pulmonary:     Effort: Pulmonary effort is normal. No respiratory distress.     Breath sounds: Normal breath sounds.  Abdominal:     Palpations: Abdomen is soft.     Tenderness: There is no  abdominal tenderness. There is no guarding.  Musculoskeletal:        General: No swelling.     Cervical back: Neck supple.  Skin:    General: Skin is warm and dry.     Capillary Refill: Capillary refill takes less than 2 seconds.  Neurological:     Mental Status: He is alert.  Psychiatric:        Mood and Affect: Mood normal.      ED Course/ Medical Decision Making/ A&P    Procedures Procedures   Medications Ordered in ED Medications  lactated ringers  bolus 1,000 mL (0 mLs Intravenous Stopped 04/12/24 0746)  iohexol  (OMNIPAQUE ) 300 MG/ML solution 100 mL (100 mLs Intravenous Contrast Given 04/12/24 0638)  alum & mag hydroxide-simeth (MAALOX/MYLANTA) 200-200-20 MG/5ML suspension 30 mL (30 mLs Oral Given 04/12/24 0746)    And  lidocaine  (XYLOCAINE ) 2 % viscous mouth solution 15 mL (15 mLs Oral Given 04/12/24 0746)   Medical Decision Making:   Adam Holland is a 44 y.o. male who presented to the ED today with abdominal pain, detailed above.    Patient placed on continuous vitals and telemetry monitoring while in ED which was reviewed periodically.  Complete initial physical exam performed, notably the patient  was HDS in NAD.     Reviewed and confirmed nursing documentation for past medical history, family history, social history.  Initial Assessment:   With the patient's presentation of abdominal pain, most likely diagnosis is nonspecific etiology. Other diagnoses were considered including (but not limited to) gastroenteritis, colitis, small bowel obstruction, appendicitis, cholecystitis, pancreatitis, nephrolithiasis, UTI, pyleonephriti. These are considered less likely due to history of present illness and physical exam findings.   This is most consistent with an acute life/limb threatening illness complicated by underlying chronic conditions.   Initial Plan:  CBC/CMP to evaluate for underlying infectious/metabolic etiology for patient's abdominal pain  Lipase to evaluate for  pancreatitis  EKG to evaluate for cardiac source of pain  CTAB/Pelvis with contrast to evaluate for structural/surgical etiology of patients' severe abdominal pain.  Urinalysis and repeat physical assessment to evaluate for UTI/Pyelonpehritis  Empiric management of symptoms with escalating pain control and antiemetics as needed.   Initial Study Results:   Laboratory  All laboratory results reviewed without evidence of clinically relevant pathology.    EKG EKG was reviewed independently. Rate, rhythm, axis, intervals all examined and without medically relevant abnormality. ST segments without concerns for elevations.    Radiology All images reviewed independently. Agree with radiology report at this time.   DG Chest 1 View Result Date: 04/12/2024 EXAM: 1 VIEW XRAY OF THE CHEST 04/12/2024 06:39:00 AM COMPARISON: Portable chest 07/23/2019. CLINICAL HISTORY: Chest pain. FINDINGS: LUNGS AND PLEURA: No focal pulmonary opacity. No pleural effusion. No pneumothorax. HEART AND MEDIASTINUM: No acute abnormality of the cardiac and mediastinal silhouettes. BONES AND SOFT TISSUES: No acute osseous abnormality. IMPRESSION: 1. No acute findings. Electronically signed by: Francis Quam MD 04/12/2024 07:07 AM EST RP Workstation: [id number]   CT ABDOMEN PELVIS W CONTRAST Result Date: 04/12/2024 EXAM: CT ABDOMEN AND PELVIS WITH CONTRAST 04/12/2024 06:38:38 AM TECHNIQUE: CT of the abdomen and pelvis was performed with the administration of 100 mL of iohexol  (OMNIPAQUE ) 300 MG/ML solution. Multiplanar reformatted images are provided for review. Automated exposure control, iterative reconstruction, and/or weight-based adjustment of the mA/kV was utilized to reduce the radiation dose to as low as reasonably achievable. COMPARISON: CT without contrast 05/20/2012. The more recent study of 06/27/2013 could not be retrieved from PACS. CLINICAL HISTORY: Abdominal pain, acute, nonlocalized. FINDINGS: LOWER CHEST: No acute  abnormality. LIVER: The liver is mildly steatotic without mass. There is a small calcified subcapsular granuloma in the tip of segment 5. GALLBLADDER AND BILE DUCTS: Gallbladder absent as before with no biliary dilatation. SPLEEN: No acute abnormality. PANCREAS: No acute abnormality. ADRENAL GLANDS: There is no adrenal mass. KIDNEYS, URETERS AND BLADDER: No stones in the kidneys or ureters. No hydronephrosis. No perinephric or periureteral stranding. There is no renal mass. 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 The bladder appears mildly thickened. Although not fully distended, there are also increased mild stranding changes around the bladder concerning for cystitis. Correlate with urinalysis for potential significance. GI AND BOWEL: Stomach demonstrates no acute abnormality. There is no bowel obstruction or inflammation. The appendix is normal. PERITONEUM AND RETROPERITONEUM: No ascites. No free air. VASCULATURE: Aorta is normal in caliber. LYMPH NODES: No lymphadenopathy. REPRODUCTIVE ORGANS: The prostate gland is slightly enlarged. BONES AND SOFT TISSUES: No acute osseous abnormality. There is a small right inguinal fat hernia. No focal soft tissue abnormality. IMPRESSION: 1. Mildly thickened bladder wall with mild perivesical stranding, concerning for cystitis; recommend urinalysis for further evaluation. 2. Slightly prominent prostate gland. 3. Incidental findings described in the body of the report. Electronically signed by: Francis Quam MD 04/12/2024 07:06 AM EST RP Workstation: [id number]   Reassessment and  Plan:   CT AP pending at time of singnout to oncoming team.  Well appearing on my reasessment. Dispo per CT/Oncoming team.       Clinical Impression:  1. Epigastric discomfort      Discharge   Final Clinical Impression(s) / ED Diagnoses Final diagnoses:  Epigastric discomfort    Rx / DC Orders ED Discharge Orders          Ordered    famotidine  (PEPCID ) 40 MG  tablet  Daily        04/12/24 0728    pantoprazole  (PROTONIX ) 20 MG tablet  Daily        04/12/24 0728    Ambulatory referral to Gastroenterology        04/12/24 [id number]              Jerral Meth, MD 04/20/24 2303  "

## 2024-04-12 NOTE — Discharge Instructions (Signed)
 Your CT imaging and laboratory evaluation was overall reassuring.  Your symptoms could be due to gastritis, a peptic ulcer or gastroparesis.  It is recommended that you follow-up outpatient with a specialist called a gastroenterologist.  We will start you on Pepcid  and Protonix  in the interim.  Additionally, follow-up with a primary care provider as it can take longer to get in with a GI doctor.

## 2024-04-12 NOTE — ED Triage Notes (Signed)
 Patient c/o abdominal pain x 1.5 weeks.  Patient has been taking laxatives without relief.  Patient denies urinary symptoms. Patient denies n/v.

## 2024-04-12 NOTE — ED Provider Notes (Signed)
" °  Physical Exam  BP (!) 152/100   Pulse 69   Temp 97.6 F (36.4 C)   Resp 16   Wt 133.8 kg   SpO2 100%   BMI 37.88 kg/m   Physical Exam  Procedures  Procedures  ED Course / MDM    Medical Decision Making Amount and/or Complexity of Data Reviewed Labs: ordered. Radiology: ordered.  Risk Prescription drug management.   72M presenting with abd pain for 1 week, hx of cholecystectomy, CT Abd Pel pending. Getting IVF. Needs reassessment after.    CT Abd Pel: FINDINGS:    LOWER CHEST:  No acute abnormality.    LIVER:  The liver is mildly steatotic without mass. There is a small calcified  subcapsular granuloma in the tip of segment 5.    GALLBLADDER AND BILE DUCTS:  Gallbladder absent as before with no biliary dilatation.    SPLEEN:  No acute abnormality.    PANCREAS:  No acute abnormality.    ADRENAL GLANDS:  There is no adrenal mass.    KIDNEYS, URETERS AND BLADDER:  No stones in the kidneys or ureters. No hydronephrosis. No perinephric or  periureteral stranding. There is no renal mass.    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    The bladder appears mildly thickened. Although not fully distended, there are  also increased mild stranding changes around the bladder concerning for  cystitis.    Correlate with urinalysis for potential significance.    GI AND BOWEL:  Stomach demonstrates no acute abnormality. There is no bowel obstruction or  inflammation. The appendix is normal.    PERITONEUM AND RETROPERITONEUM:  No ascites. No free air.    VASCULATURE:  Aorta is normal in caliber.    LYMPH NODES:  No lymphadenopathy.    REPRODUCTIVE ORGANS:  The prostate gland is slightly enlarged.    BONES AND SOFT TISSUES:  No acute osseous abnormality. There is a small right inguinal fat hernia. No  focal soft tissue abnormality.    IMPRESSION:  1. Mildly thickened bladder wall with mild perivesical stranding, concerning  for cystitis;  recommend urinalysis for further evaluation.  2. Slightly prominent prostate gland.  3. Incidental findings described in the body of the report.    Labs: Urinalysis negative for hematuria or UTI, COVID flu and RSV PCR testing negative, lipase normal, CMP unremarkable, CBC unremarkable.  Patient reassessed after IV fluids and feeling symptomatically improved.  Suspect likely viral syndrome versus mild constipation versus gastritis.  Considered peptic ulcer disease.  Considered gastroparesis.  The patient denies any THC product ingestion, denies any history of diabetes.  Will start the patient on Pepcid  and Protonix  and have him follow-up outpatient with gastroenterology, patient tolerating p.o. intake, feeling symptomatically improved following fluid resuscitation, overall stable for discharge.    Jerrol Agent, MD 04/12/24 [phone]  "
